# Patient Record
Sex: Male | Born: 1960 | Hispanic: Yes | Marital: Married | Smoking: Never smoker
Health system: Southern US, Community
[De-identification: ages and names within clinical notes are randomized; demographics above are authoritative.]

## PROBLEM LIST (undated history)

## (undated) DIAGNOSIS — I10 Essential (primary) hypertension: Secondary | ICD-10-CM

## (undated) HISTORY — PX: BACK SURGERY: SHX140

## (undated) HISTORY — PX: KNEE SURGERY: SHX244

## (undated) HISTORY — DX: Essential (primary) hypertension: I10

---

## 2008-08-12 DEATH — deceased

## 2017-05-30 ENCOUNTER — Other Ambulatory Visit (HOSPITAL_COMMUNITY): Payer: Self-pay | Admitting: Sports Medicine

## 2017-05-30 DIAGNOSIS — M25562 Pain in left knee: Secondary | ICD-10-CM

## 2017-06-02 ENCOUNTER — Ambulatory Visit (HOSPITAL_COMMUNITY)
Admission: RE | Admit: 2017-06-02 | Discharge: 2017-06-02 | Disposition: A | Discharge status: Home / Self Care | Payer: BLUE CROSS/BLUE SHIELD | Source: Ambulatory Visit | Attending: Sports Medicine | Admitting: Sports Medicine

## 2017-06-02 DIAGNOSIS — M25562 Pain in left knee: Secondary | ICD-10-CM | POA: Diagnosis present

## 2017-06-02 DIAGNOSIS — X58XXXA Exposure to other specified factors, initial encounter: Secondary | ICD-10-CM | POA: Insufficient documentation

## 2017-06-02 DIAGNOSIS — S83242A Other tear of medial meniscus, current injury, left knee, initial encounter: Secondary | ICD-10-CM | POA: Diagnosis not present

## 2017-06-02 DIAGNOSIS — M7122 Synovial cyst of popliteal space [Baker], left knee: Secondary | ICD-10-CM | POA: Diagnosis not present

## 2017-08-29 ENCOUNTER — Other Ambulatory Visit (HOSPITAL_COMMUNITY): Payer: Self-pay | Admitting: Orthopedic Surgery

## 2017-08-29 DIAGNOSIS — M7989 Other specified soft tissue disorders: Principal | ICD-10-CM

## 2017-08-29 DIAGNOSIS — M79605 Pain in left leg: Secondary | ICD-10-CM

## 2017-08-30 ENCOUNTER — Ambulatory Visit (HOSPITAL_COMMUNITY)
Admission: RE | Admit: 2017-08-30 | Discharge: 2017-08-30 | Disposition: A | Discharge status: Home / Self Care | Payer: BLUE CROSS/BLUE SHIELD | Source: Ambulatory Visit | Attending: Orthopedic Surgery | Admitting: Orthopedic Surgery

## 2017-08-30 DIAGNOSIS — M7989 Other specified soft tissue disorders: Secondary | ICD-10-CM | POA: Diagnosis present

## 2017-08-30 DIAGNOSIS — M79605 Pain in left leg: Secondary | ICD-10-CM | POA: Diagnosis present

## 2017-08-30 DIAGNOSIS — M7122 Synovial cyst of popliteal space [Baker], left knee: Secondary | ICD-10-CM | POA: Insufficient documentation

## 2018-11-15 IMAGING — US US EXTREM LOW VENOUS*L*
1 series · 13 of 24 positions shown · non-contrast
Comparison: None.

CLINICAL DATA: 56-year-old male with a history of left lower
extremity pain and swelling



[Series 1: us extrem low venous*left* · 0.08mm/px · 66 acquisitions, 13 frames shown]
[im 1/66]
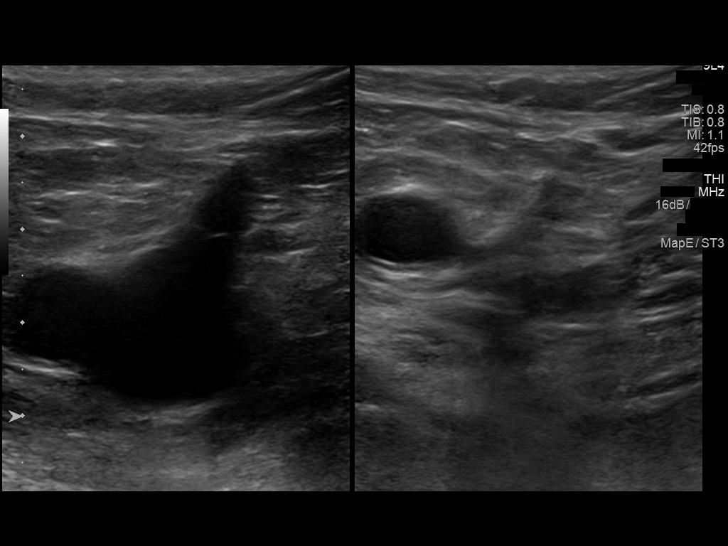
[im 6/66]
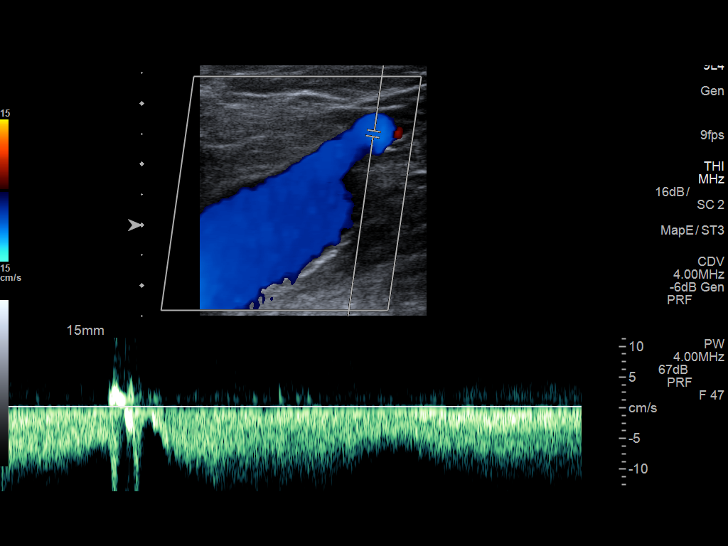
[im 12/66]
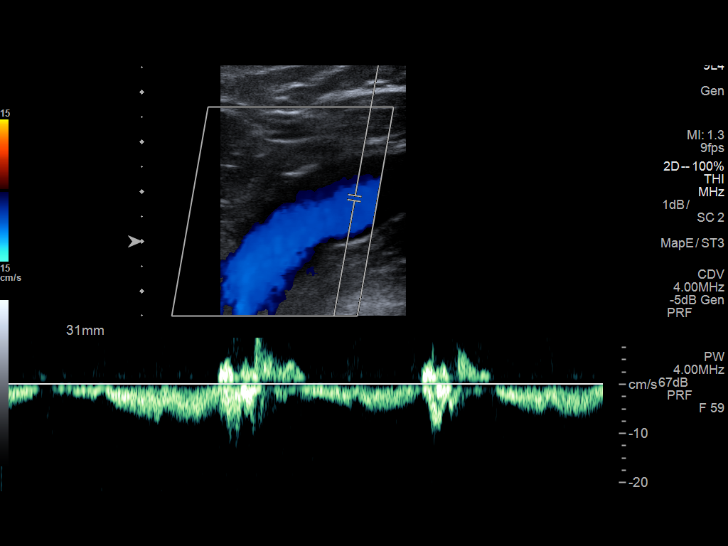
[im 17/66]
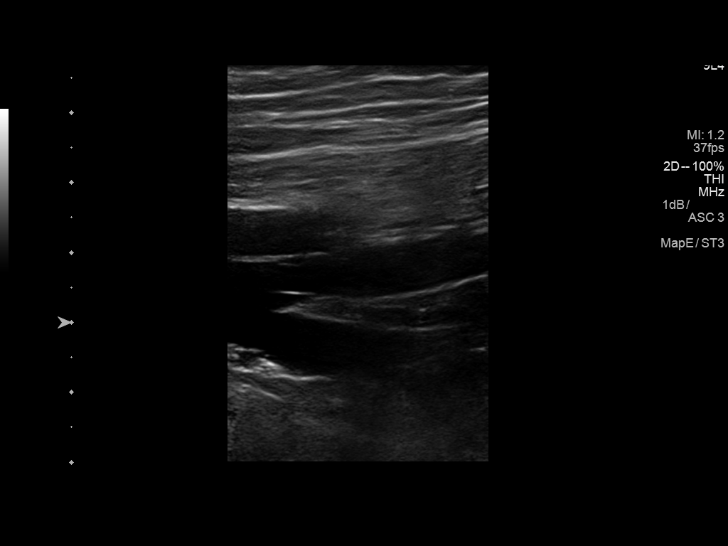
[im 23/66]
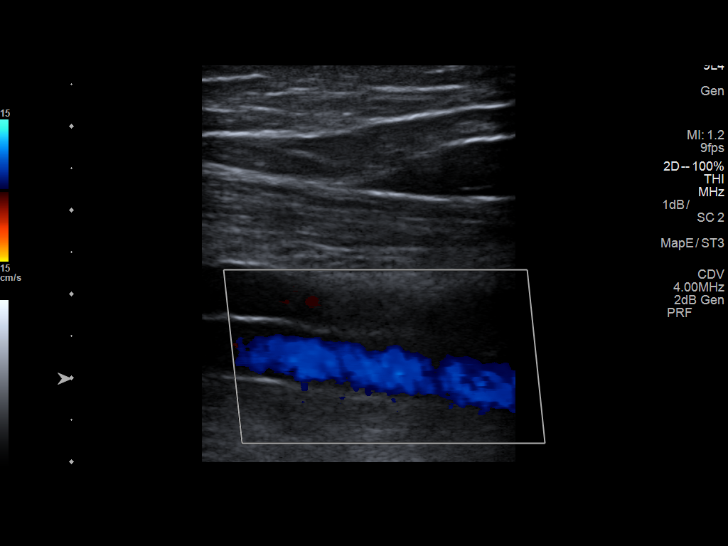
[im 29/66]
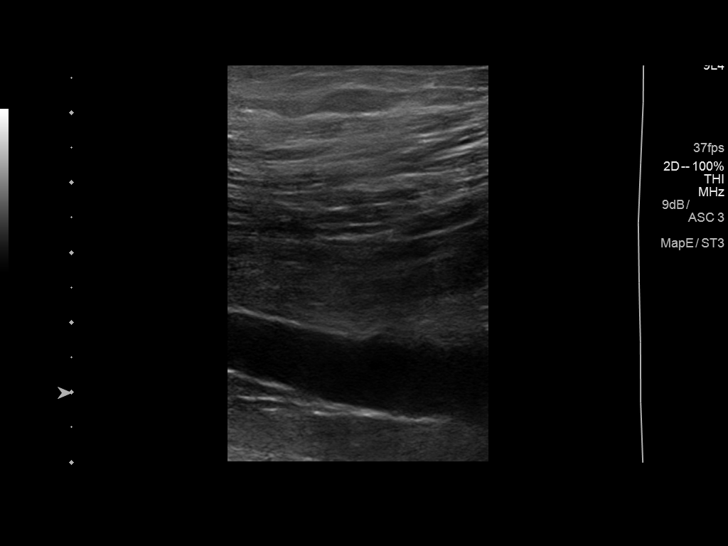
[im 37/66]
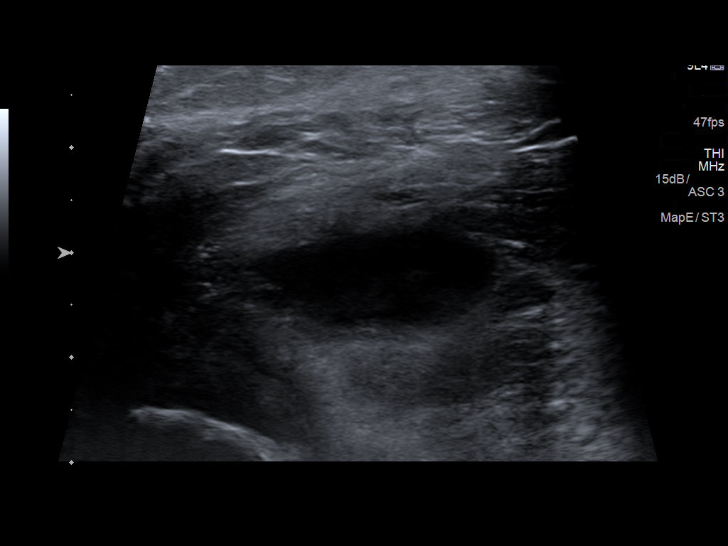
[im 37/66]
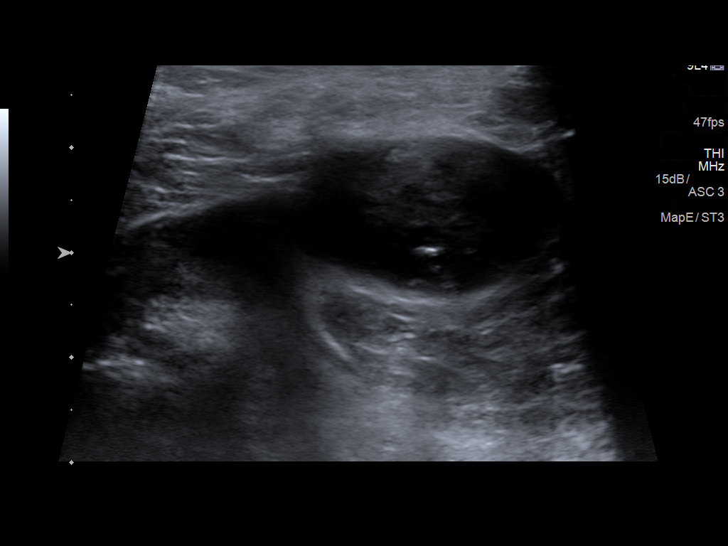
[im 43/66]
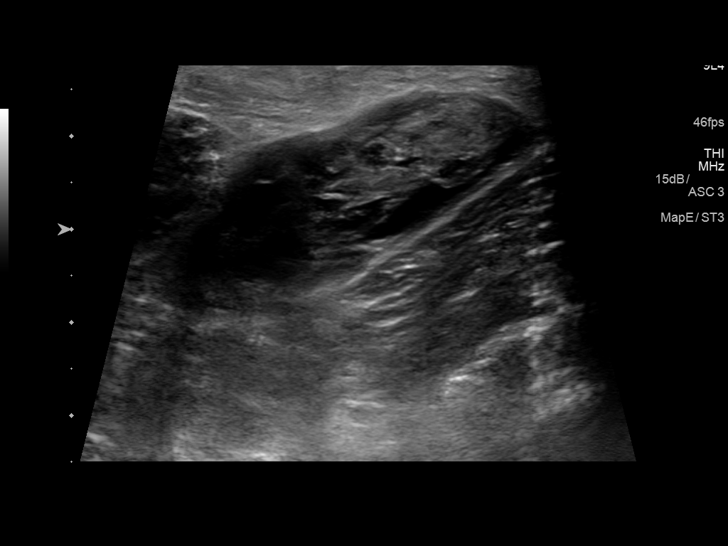
[im 49/66]
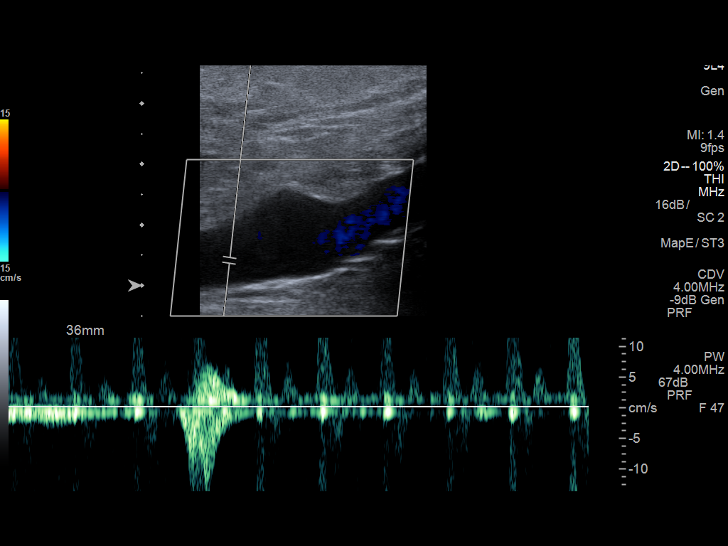
[im 54/66]
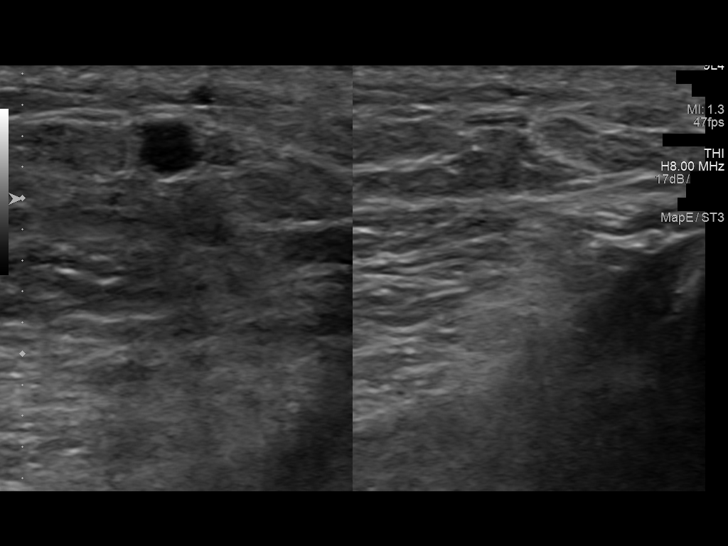
[im 60/66]
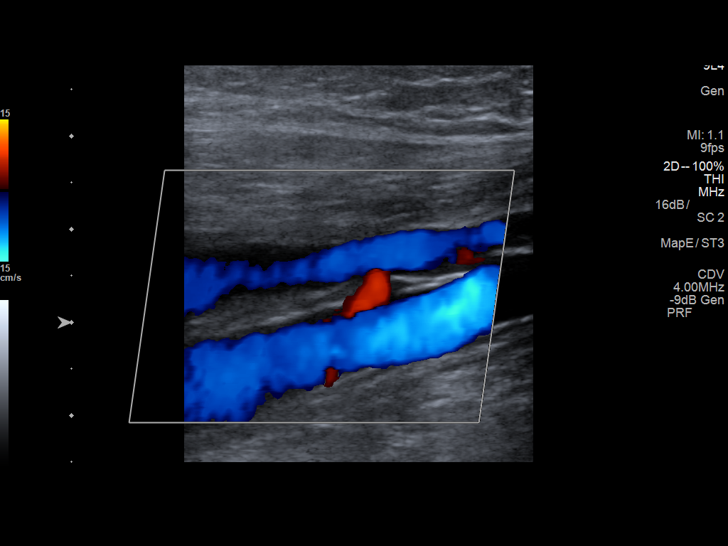
[im 66/66]
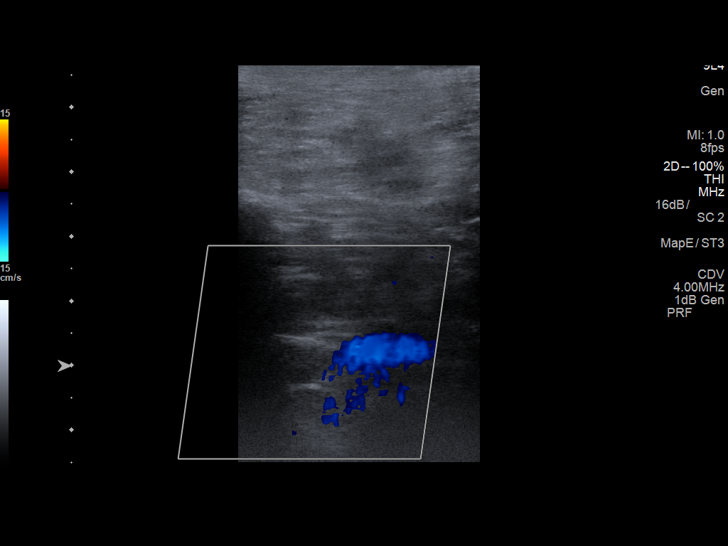

[13 of 24 positions shown; findings below may reference images not displayed]

FINDINGS: Contralateral Common Femoral Vein: Respiratory phasicity is normal
and symmetric with the symptomatic side. No evidence of thrombus.
Normal compressibility.

Common Femoral Vein: No evidence of thrombus. Normal
compressibility, respiratory phasicity and response to augmentation.

Saphenofemoral Junction: No evidence of thrombus. Normal
compressibility and flow on color Doppler imaging.

Profunda Femoral Vein: No evidence of thrombus. Normal
compressibility and flow on color Doppler imaging.

Femoral Vein: No evidence of thrombus. Normal compressibility,
respiratory phasicity and response to augmentation.

Popliteal Vein: No evidence of thrombus. Normal compressibility,
respiratory phasicity and response to augmentation.

Calf Veins: No evidence of thrombus. Normal compressibility and flow
on color Doppler imaging.

Superficial Great Saphenous Vein: No evidence of thrombus. Normal
compressibility and flow on color Doppler imaging.

Other Findings: Cystic structure in the popliteal region with
internal complexity measuring 5.0 cm x 4.7 cm x 1.7 cm
IMPRESSION: Sonographic survey of the left lower extremity negative for DVT.

Complex Baker's cyst in the left popliteal region

## 2019-03-19 ENCOUNTER — Ambulatory Visit (INDEPENDENT_AMBULATORY_CARE_PROVIDER_SITE_OTHER): Payer: 59 | Admitting: Family Medicine

## 2019-03-19 ENCOUNTER — Encounter (INDEPENDENT_AMBULATORY_CARE_PROVIDER_SITE_OTHER): Payer: Self-pay

## 2019-03-19 ENCOUNTER — Encounter (INDEPENDENT_AMBULATORY_CARE_PROVIDER_SITE_OTHER): Payer: Self-pay | Admitting: *Deleted

## 2019-03-19 ENCOUNTER — Other Ambulatory Visit: Payer: Self-pay

## 2019-03-19 ENCOUNTER — Encounter: Payer: Self-pay | Admitting: Family Medicine

## 2019-03-19 VITALS — BP 144/98 | HR 74 | Temp 98.6°F | Resp 15 | Ht 64.0 in | Wt 194.0 lb

## 2019-03-19 DIAGNOSIS — Z23 Encounter for immunization: Secondary | ICD-10-CM | POA: Diagnosis not present

## 2019-03-19 DIAGNOSIS — M62838 Other muscle spasm: Secondary | ICD-10-CM

## 2019-03-19 DIAGNOSIS — Z1159 Encounter for screening for other viral diseases: Secondary | ICD-10-CM | POA: Insufficient documentation

## 2019-03-19 DIAGNOSIS — I10 Essential (primary) hypertension: Secondary | ICD-10-CM

## 2019-03-19 DIAGNOSIS — E669 Obesity, unspecified: Secondary | ICD-10-CM

## 2019-03-19 DIAGNOSIS — Z125 Encounter for screening for malignant neoplasm of prostate: Secondary | ICD-10-CM

## 2019-03-19 DIAGNOSIS — Z1211 Encounter for screening for malignant neoplasm of colon: Secondary | ICD-10-CM | POA: Diagnosis not present

## 2019-03-19 HISTORY — DX: Encounter for screening for malignant neoplasm of prostate: Z12.5

## 2019-03-19 HISTORY — DX: Other muscle spasm: M62.838

## 2019-03-19 NOTE — Assessment & Plan Note (Signed)
US task force recommendation 

## 2019-03-19 NOTE — Assessment & Plan Note (Signed)
Rather acute within 24-hour onset of trapezius tightness muscle spasm pain.  Encouraged to stretch and use Tylenol and heating pad to help with discomfort.  As this all seems muscle related if not better within a week or 2 advised to call the office back.Patient acknowledged agreement and understanding of the plan.

## 2019-03-19 NOTE — Assessment & Plan Note (Signed)
Will be getting updated on labs and kidney function.  Prior to adjustment to medications. Strongly encouraged to follow DASH diet.  And to do 30 minutes of exercise on most days of the week.  Pending what kidney function is we will be adjusting medications as blood pressure was 140/94 on recheck.

## 2019-03-19 NOTE — Assessment & Plan Note (Signed)
Nicholas Howard is educated about the importance of exercise daily to help with weight management. A minumum of 30 minutes daily is recommended. Additionally, importance of healthy food choices  with portion control discussed.   Wt Readings from Last 3 Encounters:  03/19/19 194 lb 0.6 oz (88 kg)

## 2019-03-19 NOTE — Assessment & Plan Note (Signed)
Colonoscopy referral made today

## 2019-03-19 NOTE — Progress Notes (Signed)
Subjective:  Patient ID: Nicholas Howard, male    DOB: [birth year]  Age: 59 y.o. MRN: [id number]  CC:  Chief Complaint  Patient presents with  . New Patient (Initial Visit)    establish care      HPI  HPI Mr Nicholas Howard is a 59 year old male patient who presents today to establish care. Daughter is here to help provide translation. Previously seen at the health department but now has insurance.  Reports that he takes only one medication amlodipine benazepril combo pill.  Reports he has been taking this for 2 to 3 years.  Only history that he is aware of his hypertension.  Denies having any chest pain, leg swelling, headaches, dizziness, palpitations in the chest.  Any other signs of symptoms at this time of uncontrolled blood pressure.  Blood pressure is a little bit elevated today in the office at 144/98.  Does not currently check blood pressure at home though daughter reports that he has a cuff.    Would like to get a referral for colonoscopy.  In addition to having updated blood work.  Only concern today outside of getting blood pressure better control.  Is he has some upper back pain hurts predominantly with twisting and movement.  Denies having any injury or trauma.  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  Denies having any trouble with breathing causing difficulty or pain.  Denies have any change in range of motion.  Has not tried anything over-the-counter for this at this time.  Pain is a 4 5 out of 10 with movement.  Lives with wife and youngest daughter.  1 daughter is here to help with translation services.  Predominantly does understand English though.  Does not eat the best diet but avoids pork.  Has not had any reduction in salt.  Is not currently physically active outside of job.  But does enjoy going on walks when the weather is nice.  Has 2 dogs in the home.  Today patient denies signs and symptoms of COVID 19  infection including fever, chills, cough, shortness of breath, and headache. Past Medical, Surgical, Social History, Allergies, and Medications have been Reviewed.   Past Medical History:  Diagnosis Date  . Hypertension     Current Meds  Medication Sig  . amLODipine-benazepril (LOTREL) 10-20 MG capsule Take 1 capsule by mouth daily.    ROS:  Review of Systems  Constitutional: Negative.   HENT: Negative.   Eyes: Negative.   Respiratory: Negative.   Cardiovascular: Negative.   Gastrointestinal: Negative.   Genitourinary: Negative.   Musculoskeletal: Negative.   Skin: Negative.   Neurological: Negative.   Endo/Heme/Allergies: Negative.   Psychiatric/Behavioral: Negative.   All other systems reviewed and are negative.    Objective:   Today's Vitals: BP (!) 144/98   Pulse 74   Temp 98.6 F (37 C) (Oral)   Resp 15   Ht 5\' 4"  (1.626 m)   Wt 194 lb 0.6 oz (88 kg)   SpO2 98%   BMI 33.31 kg/m  Vitals with BMI 03/19/2019  Height 5\' 4"   Weight 194 lbs 1 oz  BMI [id number]  Systolic 123456  Diastolic 98  Pulse 74     Physical Exam Vitals and nursing note reviewed.  Constitutional:      Appearance: Normal appearance. He is well-developed and well-groomed. He is obese.  HENT:     Head: Normocephalic and atraumatic.  Right Ear: External ear normal.     Left Ear: External ear normal.     Nose: Nose normal.     Mouth/Throat:     Mouth: Mucous membranes are moist.     Pharynx: Oropharynx is clear.  Eyes:     General:        Right eye: No discharge.        Left eye: No discharge.     Conjunctiva/sclera: Conjunctivae normal.  Cardiovascular:     Rate and Rhythm: Normal rate and regular rhythm.     Pulses: Normal pulses.     Heart sounds: Normal heart sounds.  Pulmonary:     Effort: Pulmonary effort is normal.     Breath sounds: Normal breath sounds.  Musculoskeletal:        General: Normal range of motion.     Cervical back: Normal range of motion and neck supple.       Thoracic back: Spasms and tenderness present.     Comments: Tightness noted over the upper traps extending down to base of scapula. ROM intact   Skin:    General: Skin is warm.  Neurological:     General: No focal deficit present.     Mental Status: He is alert and oriented to person, place, and time.  Psychiatric:        Attention and Perception: Attention normal.        Mood and Affect: Mood normal.        Speech: Speech normal.        Behavior: Behavior normal. Behavior is cooperative.        Thought Content: Thought content normal.        Cognition and Memory: Cognition normal.        Judgment: Judgment normal.     Assessment   1. Essential hypertension   2. Obesity (BMI 30.0-34.9)   3. Encounter for hepatitis C screening test for low risk patient   4. Encounter for screening for malignant neoplasm of colon   5. Encounter for screening for malignant neoplasm of prostate   6. Trapezius muscle spasm     Tests ordered Orders Placed This Encounter  Procedures  . CBC  . COMPLETE METABOLIC PANEL WITH GFR  . Hemoglobin A1c  . Lipid panel  . HEP C AB W/REFL  . PSA  . Ambulatory referral to Gastroenterology   Plan: Please see assessment and plan per problem list above.   No orders of the defined types were placed in this encounter.   Patient to follow-up in 1 month blood pressure reading.  Perlie Mayo, NP

## 2019-03-19 NOTE — Assessment & Plan Note (Signed)
PSA level to be checked.

## 2019-03-19 NOTE — Patient Instructions (Addendum)
I appreciate the opportunity to provide you with care for your health and wellness. It was so nice to meet you both today.  Today we discussed: establish care  Follow up: 1 month for BP readings   Labs today  Will call once they are back and adjust medication based of them  GOALS: Work on increasing exercise, walking 30-60 minutes 4-5 days a week.  AVOID SALT  Increase water intake and veggies  Please continue to practice social distancing to keep you, your family, and our community safe.  If you must go out, please wear a mask and practice good handwashing.  It was a pleasure to see you and I look forward to continuing to work together on your health and well-being. Please do not hesitate to call the office if you need care or have questions about your care.  Have a wonderful day and week. With Gratitude, Cherly Beach, DNP, AGNP-BC

## 2019-03-20 LAB — PSA: PSA: 0.9 ng/mL (ref <=4.0)

## 2019-03-20 LAB — COMPLETE METABOLIC PANEL WITH GFR
AG Ratio: 1.6 (calc) (ref 1.0–2.5)
ALT: 19 U/L (ref 9–46)
AST: 16 U/L (ref 10–35)
Albumin: 4.6 g/dL (ref 3.6–5.1)
Alkaline phosphatase (APISO): 39 U/L (ref 35–144)
BUN/Creatinine Ratio: 22 (calc) (ref 6–22)
BUN: 14 mg/dL (ref 7–25)
CO2: 24 mmol/L (ref 20–32)
Calcium: 9.5 mg/dL (ref 8.6–10.3)
Chloride: 107 mmol/L (ref 98–110)
Creat: 0.64 mg/dL — ABNORMAL LOW (ref 0.70–1.33)
GFR, Est African American: 125 mL/min/{1.73_m2} (ref >=60)
GFR, Est Non African American: 108 mL/min/{1.73_m2} (ref >=60)
Globulin: 2.8 g/dL (calc) (ref 1.9–3.7)
Glucose, Bld: 88 mg/dL (ref 65–99)
Potassium: 4.2 mmol/L (ref 3.5–5.3)
Sodium: 140 mmol/L (ref 135–146)
Total Bilirubin: 1.2 mg/dL (ref 0.2–1.2)
Total Protein: 7.4 g/dL (ref 6.1–8.1)

## 2019-03-20 LAB — HEMOGLOBIN A1C
Hgb A1c MFr Bld: 5.3 % of total Hgb (ref <5.7)
Mean Plasma Glucose: 105 (calc)
eAG (mmol/L): 5.8 (calc)

## 2019-03-20 LAB — LIPID PANEL
Cholesterol: 181 mg/dL (ref <200)
HDL: 49 mg/dL (ref >=40)
LDL Cholesterol (Calc): 109 mg/dL (calc) — ABNORMAL HIGH
Non-HDL Cholesterol (Calc): 132 mg/dL (calc) — ABNORMAL HIGH (ref <130)
Total CHOL/HDL Ratio: 3.7 (calc) (ref <5.0)
Triglycerides: 121 mg/dL (ref <150)

## 2019-03-20 LAB — CBC
HCT: 46.9 % (ref 38.5–50.0)
Hemoglobin: 16.1 g/dL (ref 13.2–17.1)
MCH: 30.4 pg (ref 27.0–33.0)
MCHC: 34.3 g/dL (ref 32.0–36.0)
MCV: 88.5 fL (ref 80.0–100.0)
MPV: 12.7 fL — ABNORMAL HIGH (ref 7.5–12.5)
Platelets: 187 10*3/uL (ref 140–400)
RBC: 5.3 10*6/uL (ref 4.20–5.80)
RDW: 12.7 % (ref 11.0–15.0)
WBC: 5.8 10*3/uL (ref 3.8–10.8)

## 2019-03-20 LAB — REFLEX TIQ

## 2019-03-20 LAB — HEP C AB W/REFL
HEPATITIS C ANTIBODY REFILL$(REFL): NONREACTIVE
SIGNAL TO CUT-OFF: 0.01 (ref <1.00)

## 2019-03-21 ENCOUNTER — Other Ambulatory Visit: Payer: Self-pay | Admitting: Family Medicine

## 2019-03-21 DIAGNOSIS — I10 Essential (primary) hypertension: Secondary | ICD-10-CM

## 2019-03-21 MED ORDER — AMLODIPINE BESY-BENAZEPRIL HCL 10-40 MG PO CAPS: 1.0000 | ORAL_CAPSULE | Freq: Every day | ORAL | 3 refills | Status: DC

## 2019-04-19 ENCOUNTER — Ambulatory Visit (INDEPENDENT_AMBULATORY_CARE_PROVIDER_SITE_OTHER): Payer: 59 | Admitting: Family Medicine

## 2019-04-19 ENCOUNTER — Other Ambulatory Visit: Payer: Self-pay

## 2019-04-19 ENCOUNTER — Encounter: Payer: Self-pay | Admitting: Family Medicine

## 2019-04-19 VITALS — BP 126/82 | HR 65 | Temp 97.7°F | Resp 15 | Ht 64.0 in | Wt 193.1 lb

## 2019-04-19 DIAGNOSIS — E669 Obesity, unspecified: Secondary | ICD-10-CM | POA: Diagnosis not present

## 2019-04-19 DIAGNOSIS — E785 Hyperlipidemia, unspecified: Secondary | ICD-10-CM | POA: Insufficient documentation

## 2019-04-19 DIAGNOSIS — E7841 Elevated Lipoprotein(a): Secondary | ICD-10-CM

## 2019-04-19 DIAGNOSIS — I10 Essential (primary) hypertension: Secondary | ICD-10-CM | POA: Diagnosis not present

## 2019-04-19 DIAGNOSIS — E66811 Obesity, class 1: Secondary | ICD-10-CM

## 2019-04-19 NOTE — Assessment & Plan Note (Addendum)
Obesity is linked to hypertension and elevated lipoprotein.   educated about the importance of exercise daily to help with weight management. A minumum of 30 minutes daily is recommended. Additionally, importance of healthy food choices  with portion control discussed.  Wt Readings from Last 3 Encounters:  04/19/19 193 lb 1.9 oz (87.6 kg)  03/19/19 194 lb 0.6 oz (88 kg)

## 2019-04-19 NOTE — Assessment & Plan Note (Signed)
Nicholas Howard is encouraged to maintain a well balanced diet that is low in salt. Controlled, continue current medication regimen.  No refills needed  Additionally, he is also reminded that exercise is beneficial for heart health and control of  Blood pressure. 30-60 minutes daily is recommended-walking was suggested. Daughter reports that they got an air [name] and has been using it.  Will provide Spanish version of both DASH diet and cholesterol content for food.

## 2019-04-19 NOTE — Progress Notes (Signed)
Subjective:  Patient ID: Nicholas Howard, male    DOB: 1960-05-01  Age: 59 y.o. MRN: [id number]  CC:  Chief Complaint  Patient presents with  . Hypertension    follow up bp evaluation      HPI  HPI  Mr Nicholas Howard is a 59 year old male patient who presents today to follow-up with hypertension.  Daughter is here to help with translation/interpretation.  He was on amlodipine benazepril combo pill when I first met him back in January.  Blood pressure was elevated that day in the office 144/98.  He denied having any signs or awareness of hypertension.  And was not currently checking his blood pressure at home even though he had a cuff.  Labs at that visit were really good.  No need to adjust medications.  Blood pressure today in the office 126/82.  Reports that he is trying to eat better as well to help with his cholesterol.  Daughter reports that they got an air [place].  And they would like a list of foods if possible that he could have safely with cholesterol diet.  Is trying to follow a DASH diet as well.  Is not currently doing much exercise.  Has no other concerns or questions today in the office.  Today patient denies signs and symptoms of COVID 19 infection including fever, chills, cough, shortness of breath, and headache. Past Medical, Surgical, Social History, Allergies, and Medications have been Reviewed.   Past Medical History:  Diagnosis Date  . Encounter for screening for malignant neoplasm of prostate 03/19/2019  . Hypertension   . Trapezius muscle spasm 03/19/2019    Current Meds  Medication Sig  . amLODipine-benazepril (LOTREL) 10-40 MG capsule Take 1 capsule by mouth daily.    ROS:  Review of Systems  Constitutional: Negative.   HENT: Negative.   Eyes: Negative.   Respiratory: Negative.   Cardiovascular: Negative.   Gastrointestinal: Negative.   Genitourinary: Negative.   Musculoskeletal: Negative.   Skin: Negative.   Neurological: Negative.    Endo/Heme/Allergies: Negative.   Psychiatric/Behavioral: Negative.   All other systems reviewed and are negative.    Objective:   Today's Vitals: BP 126/82   Pulse 65   Temp 97.7 F (36.5 C) (Temporal)   Resp 15   Ht '5\' 4"'  (1.626 m)   Wt 193 lb 1.9 oz (87.6 kg)   SpO2 98%   BMI 33.15 kg/m  Vitals with BMI 04/19/2019 03/19/2019  Height '5\' 4"'  '5\' 4"'   Weight 193 lbs 2 oz 194 lbs 1 oz  BMI 78.29 56.21  Systolic 308 657  Diastolic 82 98  Pulse 65 74     Physical Exam Vitals and nursing note reviewed.  Constitutional:      Appearance: Normal appearance. He is well-developed and well-groomed. He is obese.  HENT:     Head: Normocephalic and atraumatic.     Right Ear: External ear normal.     Left Ear: External ear normal.     Nose: Nose normal.     Mouth/Throat:     Mouth: Mucous membranes are moist.     Pharynx: Oropharynx is clear.  Eyes:     General:        Right eye: No discharge.        Left eye: No discharge.     Conjunctiva/sclera: Conjunctivae normal.  Cardiovascular:     Rate and Rhythm: Normal rate and regular rhythm.     Pulses: Normal pulses.  Heart sounds: Normal heart sounds.  Pulmonary:     Effort: Pulmonary effort is normal.     Breath sounds: Normal breath sounds.  Musculoskeletal:        General: Normal range of motion.     Cervical back: Normal range of motion and neck supple.  Skin:    General: Skin is warm.  Neurological:     General: No focal deficit present.     Mental Status: He is alert and oriented to person, place, and time.  Psychiatric:        Attention and Perception: Attention normal.        Mood and Affect: Mood normal.        Speech: Speech normal.        Behavior: Behavior normal. Behavior is cooperative.        Thought Content: Thought content normal.        Cognition and Memory: Cognition normal.        Judgment: Judgment normal.      Assessment   1. Essential hypertension   2. Obesity (BMI 30.0-34.9)   3. Elevated  lipoprotein(a)     Tests ordered No orders of the defined types were placed in this encounter.    Plan: Please see assessment and plan per problem list above.   No orders of the defined types were placed in this encounter.   Patient to follow-up in 08/16/2019 annual   Perlie Mayo, NP

## 2019-04-19 NOTE — Patient Instructions (Addendum)
Happy New Year! May you have a year filled with hope, love, happiness and laughter.  I appreciate the opportunity to provide you with care for your health and wellness. Today we discussed:   Follow up: 4-5 month annual   No labs or referrals today  Attached in a list of cholesterol content in foods.  Please stick to 200 mg or lower daily   Please continue to practice social distancing to keep you, your family, and our community safe.  If you must go out, please wear a mask and practice good handwashing.  It was a pleasure to see you and I look forward to continuing to work together on your health and well-being. Please do not hesitate to call the office if you need care or have questions about your care.  Have a wonderful day and week. With Gratitude, Cherly Beach, DNP, AGNP-BC   Contenido de colesterol en los alimentos Cholesterol Content in Foods El colesterol es una sustancia cerosa, parecida a la grasa, que contribuye a transportar la grasa en la [place]. El cuerpo Occupational hygienist en pequeas cantidades, pero el exceso de colesterol puede causar dao en las arterias y Film/video editor. La mayora de las personas debera consumir menos de 200 miligramos (mg) de Special educational needs teacher. Alimentos con colesterol  El colesterol se encuentra en los alimentos de origen animal, como la carne, los pescados y mariscos y los productos lcteos. En general, los productos lcteos descremados y las carnes magras tienen menos colesterol que los productos lcteos enteros y las carnes grasas. A continuacin, se enumeran los miligramos de colesterol por porcin (mg por porcin) de los alimentos comunes que contienen colesterol. Carne y otras protenas  Huevo: un huevo entero grande tiene 186mg .  Pata de ternera: 4 onzas (115g) tienen 141mg .  Carne picada magra de pavo (93% magra): 4 onzas (115g) tienen 118mg .  Lomo de cordero desgrasado: 4 onzas (115g) tienen 106mg .  Carne picada magra de  res (90% magra): 4 onzas (115g) tienen 100mg .  Bethena Midget: 3,5 onzas (100g) tienen 90mg .  Chuletas de cerdo: 4 onzas (115g) tienen 86mg .  Salmn enlatado: 3,5 onzas (100g) tienen 83mg .  Lomo de res desgrasado: 4 onzas (115g) tienen 78mg .  Salchicha: 1 salchicha (3,5 onzas o 100g) tiene 77mg .  Cangrejo: 3,5 onzas (100g) tienen 71mg .  Pollo asado sin piel, carne blanca: 4 onzas (115g) tienen 66mg .  Salchichn light: 2 onzas (57g) tienen 45mg .  Fiambre de pavita: 2 onzas (57g) tienen 31mg .  Atn enlatado: 3,5 onzas (100g) tienen 31mg .  Tocino: 1 onza (28g) tiene 29mg .  Ostras y mejillones (crudos): 3,5 onzas (100g) tienen 25mg .  Caballa: 1 onza (28g) tiene 22mg .  Trucha: 1 onza (28g) tiene 20mg .  Salchicha de cerdo: 1 salchicha (1 onza o 28g) tiene 17mg .  Salmn: 1 onza (28g) tiene 16mg .  Tilapia: 1 onza (28g) tiene 14 mg. Lcteos  Helado cremoso:  taza (4 onzas o 115g) tiene 103mg .  Yogur entero: 1 taza (8 onzas o 227g) tiene 29mg .  Queso cheddar: 1onza (28g) tiene 28mg .  Queso americano: 1 onza (28g) tiene 28mg .  Leche entera: 1 taza (8 onzas o 227g) tiene 23mg .  Leche con contenido reducido de grasa (2%): 1 taza (8 onzas o 227g) tiene 18mg .  Queso crema: 1 cucharada tiene 15mg .  Requesn:  taza (4 onzas o 115g) tiene 14mg .  Leche con bajo contenido de grasa (1%): 1 taza (8 onzas o 227g) tiene 10mg .  Crema cida: 1 cucharada tiene 8,5mg .  Yogur con bajo contenido de  grasa: 1 taza (8 onzas o 227g) tiene 8mg .  Yogur griego sin contenido de grasa: 1 taza (8 onzas o 227g) tiene 7mg .  Crema de [place]: 1 cucharada tiene 5mg . Grasas y aceites  Aceite de hgado de bacalao: 1 cucharada tiene 82mg .  Manteca: 1 cucharada tiene 15mg .  Grasa de cerdo: 1 cucharada tiene 14mg .  Grasa de tocino: 1 cucharada tiene 14mg .  Mayonesa: 1 cucharada tiene de 5 a 10mg .  Margarina: 1 cucharada tiene de 3  a 10mg . Las cantidades exactas de colesterol que contienen estos alimentos pueden variar dependiendo de los ingredientes especficos y las [place]. Alimentos sin colesterol La mayora de los alimentos a base de plantas no tiene colesterol, a menos que se combinen con un alimento que s tiene Research officer, trade union. Algunos alimentos sin colesterol son los siguientes:  Granos y Actor.  Verduras.  Lambert Mody.  Aceites vegetales, como el de [place], de canola y de [place].  Legumbres, [employer], los frijoles y las lentejas.  Frutos secos y semillas.  Claras de huevo. Resumen  El cuerpo necesita colesterol en pequeas cantidades, pero el exceso de colesterol puede causar dao en las arterias y Film/video editor.  La mayora de las personas debera consumir menos de 200 miligramos (mg) de Special educational needs teacher. Esta informacin no tiene Marine scientist el consejo del mdico. Asegrese de hacerle al mdico cualquier pregunta que tenga. Document Revised: 01/27/2017 Document Reviewed: 01/27/2017 Elsevier Patient Education  [place] de alimentacin DASH DASH Eating Plan DASH es la sigla en ingls de "Enfoques Alimentarios para Detener la Hipertensin" (Dietary Approaches to Stop Hypertension). El plan de alimentacin DASH ha demostrado bajar la presin arterial elevada (hipertensin). Tambin puede reducir [employer] de diabetes tipo 2, enfermedad cardaca y accidente cerebrovascular. Este plan tambin puede ayudar a Horticulturist, commercial. Consejos para seguir este plan  Pautas generales  Evite ingerir ms de 2,300 mg (miligramos) de sal (sodio) por da. Si tiene hipertensin, es posible que necesite reducir la ingesta de sodio a 1,500 mg por da.  Limite el consumo de alcohol a no ms de 25medida por da si es mujer y no est [place], y 71medidas por da si es hombre. Una medida equivale a 12oz (374ml) de cerveza, 5oz (162ml) de vino o 1oz (78ml) de bebidas alcohlicas de alta  graduacin.  Trabaje con su mdico para mantener un peso saludable o perder [employer]. Pregntele cul es el peso recomendado para usted.  Realice al menos 30 minutos de ejercicio que haga que se acelere su corazn (ejercicio Arboriculturist) la [employer] de la [place]. Estas actividades pueden incluir caminar, nadar o andar en bicicleta.  Trabaje con su mdico o especialista en alimentacin y nutricin (nutricionista) para ajustar su plan alimentario a sus necesidades calricas personales. Lectura de las etiquetas de los alimentos   Verifique en las etiquetas de los alimentos, la cantidad de sodio por porcin. Elija alimentos con menos del 5 por ciento del valor diario de sodio. Generalmente, los alimentos con menos de 300 mg de sodio por porcin se encuadran dentro de este plan alimentario.  Para encontrar cereales integrales, busque la palabra "integral" como primera palabra en la lista de ingredientes. De compras  Compre productos en los que en su etiqueta diga: "bajo contenido de sodio" o "sin agregado de sal".  Compre alimentos frescos. Evite los alimentos enlatados y comidas precocidas o congeladas. Coccin  Evite agregar sal cuando cocine. Use hierbas o aderezos sin sal, en lugar de sal de  mesa o sal marina. Consulte al mdico o farmacutico antes de usar sustitutos de la sal.  No fra los alimentos. A la hora de cocinar los alimentos opte por hornearlos, hervirlos, grillarlos y asarlos a Administrator, arts.  Cocine con aceites cardiosaludables, como oliva, canola, soja o girasol. Planificacin de las comidas  Consuma una dieta equilibrada, que incluya lo siguiente: ? 5o ms porciones de frutas y Set designer. Trate de que la mitad del plato de cada comida sean frutas y verduras. ? Hasta 6 u 8 porciones de cereales integrales por da. ? Menos de 6 onzas de carne, aves o pescado Games developer. Una porcin de 3 onzas de carne tiene casi el mismo tamao que un mazo de cartas. Un huevo  equivale a 1 onza. ? Dos porciones de productos lcteos descremados por Training and development officer. ? Una porcin de frutos secos, semillas o frijoles 5 veces por semana. ? Grasas cardiosaludables. Las grasas saludables llamadas cidos grasos omega-3 se encuentran en alimentos como semillas de lino y pescados de agua fra, como por ejemplo, sardinas, salmn y caballa.  Limite la cantidad que ingiere de los siguientes alimentos: ? Alimentos enlatados o envasados. ? Alimentos con alto contenido de grasa trans, como alimentos fritos. ? Alimentos con alto contenido de grasa saturada, como carne con grasa. ? Dulces, postres, bebidas azucaradas y otros alimentos con azcar agregada. ? Productos lcteos enteros.  No le agregue sal a los alimentos antes de probarlos.  Trate de comer al menos 2 comidas vegetarianas por semana.  Consuma ms comida casera y menos de restaurante, de bufs y comida rpida.  Cuando coma en un restaurante, pida que preparen su comida con menos sal o, en lo posible, sin nada de sal. Qu alimentos se recomiendan? Los alimentos enumerados a continuacin no constituyen Furniture conservator/restorer. Hable con el nutricionista sobre las mejores opciones alimenticias para usted. Cereales Pan de salvado o integral. Pasta de salvado o integral. Arroz integral. Avena. Quinua. Trigo burgol. Cereales integrales y con bajo contenido de sodio. Pan pita. Galletitas de [place] con bajo contenido de [place] y [place]. Tortillas de [place] integral. Verduras Verduras frescas o congeladas (crudas, al vapor, asadas o grilladas). Jugos de tomate y verduras con bajo contenido de sodio o reducidos en sodio. Salsa y pasta de tomate con bajo contenido de sodio o reducidas en sodio. Verduras enlatadas con bajo contenido de sodio o reducidas en sodio. Frutas Todas las frutas frescas, congeladas o disecadas. Frutas enlatadas en jugo natural (sin agregado de azcar). Carne y otros alimentos proteicos Pollo o pavo sin piel. Carne de pollo  o de [place]. Cerdo desgrasado. Pescado y [employer]. Claras de huevo. Porotos, guisantes o lentejas secos. Frutos secos, mantequilla de frutos secos y semillas sin sal. Frijoles enlatados sin sal. Cortes de carne vacuna magra, desgrasada. Embutidos magros, con bajo contenido de [place]. Lcteos Leche descremada (1%) o descremada. Quesos sin grasa, con bajo contenido de grasa o descremados. Queso blanco o ricota sin grasa, con bajo contenido de [place]. Yogur semidescremado o descremado. Queso con bajo contenido de [place] y [place]. Grasas y [employer] untables que no contengan grasas trans. Aceite vegetal. Lubertha Basque y aderezos para ensaladas livianos o con bajo contenido de grasas (reducidos en sodio). Aceite de canola, crtamo, oliva, soja y [place]. [place]. Condimentos y otros alimentos Hierbas. Especias. Mezclas de condimentos sin sal. Palomitas de maz y pretzels sin sal. Dulces con bajo contenido de grasas. Qu alimentos no se recomiendan? Los alimentos enumerados a continuacin no constituyen Furniture conservator/restorer  lista completa. Hable con el nutricionista sobre las mejores opciones alimenticias para usted. Cereales Productos de panificacin hechos con grasa, como medialunas, magdalenas y algunos panes. Comidas con arroz o pasta seca listas para usar. Verduras Verduras con crema o fritas. Verduras en [place]. Verduras enlatadas regulares (que no sean con bajo contenido de sodio o reducidas en sodio). Pasta y salsa de tomates enlatadas regulares (que no sean con bajo contenido de sodio o reducidas en sodio). Jugos de tomate y verduras regulares (que no sean con bajo contenido de sodio o reducidos en sodio). Pepinillos. Aceitunas. Lambert Mody Fruta enlatada en almbar liviano o espeso. Frutas cocidas en aceite. Frutas con salsa de crema o [place]. Carne y otros alimentos proteicos Cortes de carne con grasa. Costillas. Carne frita. [place]ocino. Salchichas. Mortad[place] y otras carnes procesadas. Salame. Panceta.  Perros calientes (hotdogs). [place]. Frutos secos y semillas con sal. Frijoles enlatados con agregado de sal. Pescado enlatado o ahumado. Huevos enteros o yemas. Pollo o pavo con piel. Lcteos Leche entera o al 2%, crema y mitad leche y mitad crema. Queso crema entero o con toda su grasa. Yogur entero o endulzado. Quesos con toda su grasa. Sustitutos de cremas no lcteas. Coberturas batidas. Quesos para untar y quesos procesados. Grasas y [employer]. Margarina en barra. [place]. Materia grasa. Mantequilla clarificada. Grasa de panceta. Aceites tropicales como aceite de coco, palmiste o palma. Condimentos y otros alimentos Palomitas de maz y pretzels con sal. Sal de cebolla, sal de ajo, sal condimentada, sal de mesa y sal marina. Salsa [place]. Salsa trtara. Salsa barbacoa. Salsa teriyaki. Salsa de soja, incluso la que tiene contenido reducido de [place]. Salsa de carne. Salsas en lata y envasadas. Salsa de pescado. Salsa de [place]. Salsa rosada. Rbano picante envasado. [place]. Mostaza. Saborizantes y tiernizantes para carne. Caldo en cubitos. Salsa picante y salsa tabasco. Escabeches envasados o ya preparados. Aderezos para tacos prefabricados o envasados. Salsas. Aderezos comunes para ensalada. Dnde encontrar ms informacin:  [place], los Pulmones y Herbalist (National Heart, Lung, and [place]): https://wilson-eaton.com/  Asociacin Estadounidense del Corazn (American Heart Association): www.heart.org Resumen  El plan de alimentacin DASH ha demostrado bajar la presin arterial elevada (hipertensin). Tambin puede reducir UnitedHealth de diabetes tipo 2, enfermedad cardaca y accidente cerebrovascular.  Con el plan de alimentacin DASH, deber limitar el consumo de sal (sodio) a 2,300 mg por da. Si tiene hipertensin, es posible que necesite reducir la ingesta de sodio a 1,500 mg por da.  Cuando siga el plan de alimentacin DASH,  trate de comer ms frutas frescas y verduras, cereales integrales, carnes magras, lcteos descremados y grasas cardiosaludables.  Trabaje con su mdico o especialista en alimentacin y nutricin (nutricionista) para ajustar su plan alimentario a sus necesidades calricas personales. Esta informacin no tiene Marine scientist el consejo del mdico. Asegrese de hacerle al mdico cualquier pregunta que tenga. Document Revised: 06/20/2016 Document Reviewed: 06/20/2016 Elsevier Patient Education  [place].

## 2019-04-19 NOTE — Assessment & Plan Note (Signed)
Hyperlipidemia is linked to obesity.  Encouraged to eat a low-fat diet.  Provided with Spanish version of cholesterol content food.

## 2019-06-08 ENCOUNTER — Ambulatory Visit: Payer: 59 | Attending: Internal Medicine

## 2019-06-08 DIAGNOSIS — Z23 Encounter for immunization: Secondary | ICD-10-CM

## 2019-06-08 NOTE — Progress Notes (Signed)
   Covid-19 Vaccination Clinic  Name:  Nicholas Howard    MRN: [id number] DOB: [birth year]  06/08/2019  Mr. Nicholas Howard was observed post Covid-19 immunization for 15 minutes without incident. He was provided with Vaccine Information Sheet and instruction to access the V-Safe system.   Mr. Nicholas Howard was instructed to call 911 with any severe reactions post vaccine: Marland Kitchen Difficulty breathing  . Swelling of face and throat  . A fast heartbeat  . A bad rash all over body  . Dizziness and weakness   Immunizations Administered    Name Date Dose VIS Date Route   Pfizer COVID-19 Vaccine 06/08/2019  4:18 PM 0.3 mL 02/22/2019 Intramuscular   Manufacturer: [employer], [employer]   Lot: [id number]   [place]: [id number]

## 2019-06-21 ENCOUNTER — Other Ambulatory Visit: Payer: Self-pay

## 2019-06-21 ENCOUNTER — Telehealth: Payer: Self-pay

## 2019-06-21 DIAGNOSIS — I10 Essential (primary) hypertension: Secondary | ICD-10-CM

## 2019-06-21 MED ORDER — AMLODIPINE BESY-BENAZEPRIL HCL 10-40 MG PO CAPS: 1.0000 | ORAL_CAPSULE | Freq: Every day | ORAL | 3 refills | Status: DC

## 2019-06-21 NOTE — Telephone Encounter (Signed)
Refill sent.

## 2019-06-21 NOTE — Telephone Encounter (Signed)
Amlodipine-benazepril please send to [facility] in [place]

## 2019-07-02 ENCOUNTER — Ambulatory Visit: Payer: 59 | Attending: Internal Medicine

## 2019-07-02 DIAGNOSIS — Z23 Encounter for immunization: Secondary | ICD-10-CM

## 2019-07-02 NOTE — Progress Notes (Signed)
   Covid-19 Vaccination Clinic  Name:  Nicholas Howard    MRN: [id number] DOB: 1960/04/21  07/02/2019  Nicholas Howard was observed post Covid-19 immunization for 15 minutes without incident. He was provided with Vaccine Information Sheet and instruction to access the V-Safe system.   Nicholas Howard was instructed to call 911 with any severe reactions post vaccine: Marland Kitchen Difficulty breathing  . Swelling of face and throat  . A fast heartbeat  . A bad rash all over body  . Dizziness and weakness   Immunizations Administered    Name Date Dose VIS Date Route   Pfizer COVID-19 Vaccine 07/02/2019  4:10 PM 0.3 mL 05/08/2018 Intramuscular   Manufacturer: [place]   Lot: [id number]   [place]: [id number]

## 2019-08-16 ENCOUNTER — Other Ambulatory Visit: Payer: Self-pay

## 2019-08-16 ENCOUNTER — Encounter: Payer: Self-pay | Admitting: Family Medicine

## 2019-08-16 ENCOUNTER — Ambulatory Visit (INDEPENDENT_AMBULATORY_CARE_PROVIDER_SITE_OTHER): Payer: 59 | Admitting: Family Medicine

## 2019-08-16 VITALS — BP 124/82 | HR 64 | Temp 98.0°F | Ht 64.0 in | Wt 187.0 lb

## 2019-08-16 DIAGNOSIS — Z0001 Encounter for general adult medical examination with abnormal findings: Secondary | ICD-10-CM | POA: Diagnosis not present

## 2019-08-16 DIAGNOSIS — I10 Essential (primary) hypertension: Secondary | ICD-10-CM

## 2019-08-16 DIAGNOSIS — E669 Obesity, unspecified: Secondary | ICD-10-CM | POA: Diagnosis not present

## 2019-08-16 DIAGNOSIS — Z23 Encounter for immunization: Secondary | ICD-10-CM | POA: Diagnosis not present

## 2019-08-16 DIAGNOSIS — E7841 Elevated Lipoprotein(a): Secondary | ICD-10-CM | POA: Diagnosis not present

## 2019-08-16 DIAGNOSIS — Z125 Encounter for screening for malignant neoplasm of prostate: Secondary | ICD-10-CM

## 2019-08-16 LAB — POCT GLYCOSYLATED HEMOGLOBIN (HGB A1C): Hemoglobin A1C: 5.2 % (ref 4.0–5.6)

## 2019-08-16 MED ORDER — AMLODIPINE BESY-BENAZEPRIL HCL 10-40 MG PO CAPS: 1.0000 | ORAL_CAPSULE | Freq: Every day | ORAL | 1 refills | Status: DC

## 2019-08-16 NOTE — Assessment & Plan Note (Signed)
Improved  Horst Ostermiller is re-educated about the importance of exercise daily to help with weight management. A minumum of 30 minutes daily is recommended. Additionally, importance of healthy food choices  with portion control discussed. Wt Readings from Last 3 Encounters:  08/16/19 187 lb (84.8 kg)  04/19/19 193 lb 1.9 oz (87.6 kg)  03/19/19 194 lb 0.6 oz (88 kg)

## 2019-08-16 NOTE — Patient Instructions (Addendum)
I appreciate the opportunity to provide you with care for your health and wellness. Today we discussed: overall health   Follow up: 6 months   Labs-fasting today at [facility] in office today Pneumonia Vaccine 13 version today-sore are use tylenol if needed No referrals today  Please continue to practice social distancing to keep you, your family, and our community safe.  If you must go out, please wear a mask and practice good handwashing.  Safe travels to [place]  It was a pleasure to see you and I look forward to continuing to work together on your health and well-being. Please do not hesitate to call the office if you need care or have questions about your care.  Have a wonderful day and week. With Gratitude, Cherly Beach, DNP, AGNP-BC  HEALTH MAINTENANCE RECOMMENDATIONS:  It is recommended that you get at least 30 minutes of aerobic exercise at least 5 days/week (for weight loss, you may need as much as 60-90 minutes). This can be any activity that gets your heart rate up. This can be divided in 10-15 minute intervals if needed, but try and build up your endurance at least once a week.  Weight bearing exercise is also recommended twice weekly.  Eat a healthy diet with lots of vegetables, fruits and fiber.  "Colorful" foods have a lot of vitamins (ie green vegetables, tomatoes, red peppers, etc).  Limit sweet tea, regular sodas and alcoholic beverages, all of which has a lot of calories and sugar.  Up to 2 alcoholic drinks daily may be beneficial for men (unless trying to lose weight, watch sugars).  Drink a lot of water.  Sunscreen of at least SPF 30 should be used on all sun-exposed parts of the skin when outside between the hours of 10 am and 4 pm (not just when at beach or pool, but even with exercise, golf, tennis, and yard work!)  Use a sunscreen that says "broad spectrum" so it covers both UVA and UVB rays, and make sure to reapply every 1-2 hours.  Remember to change the  batteries in your smoke detectors when changing your clock times in the spring and fall.  Use your seat belt every time you are in a car, and please drive safely and not be distracted with cell phones and texting while driving.

## 2019-08-16 NOTE — Assessment & Plan Note (Signed)
Labs ordered Denies S&S of BPH or prostatitis

## 2019-08-16 NOTE — Progress Notes (Signed)
Health Maintenance reviewed -  Immunization History  Administered Date(s) Administered  . Influenza,inj,Quad PF,6+ Mos 03/19/2019  . PFIZER SARS-COV-2 Vaccination 06/08/2019, 07/02/2019  . Tdap 08/26/2013   Last colonoscopy: doing paperwork for this Last PSA: Ordered today Dentist: 3 years ago, no trouble with teeth, floss and brushes regularly Ophtho: no, and no vision changes  Exercise: at work only Smoker: no Alcohol Use: no  Other doctors caring for patient include:  Patient Care Team: Perlie Mayo, NP as PCP - General (Family Medicine)  End of Life Discussion:  Patient does not have a living will and medical power of attorney  Subjective:   HPI  Nicholas Howard is a 59 y.o. male who presents for annual wellness visit and follow-up on chronic medical conditions.  She has the following concerns: None presents today with family to help with communication.  Preferred language is Spanish.  Nicholas Howard is a 59 year old male patient who presents today for his annual appointment.  Overall he is doing well he has no complaints.  He has lost a few pounds since his last appointment.  He reports taking his blood pressure medicine as directed.  Reports trying to eat healthier.  Reports trying to increase his water intake.   Denies having any sleep trouble.  Denies having any problems chewing swallowing or dentition problems.  Denies having any changes in appetite.  Denies having any changes in bowel or bladder habits.  Denies having any blood in urine or stool.  Reports that his stream is good.  Only reports getting up 1 time in the middle of the night to void.  Denies having any memory changes or confusion.  Denies having any falls or injury.  Denies having any skin issues or concerns.  Does work outside does not usually wear sunscreen. Denies having any chest pain, headaches, vision changes, dizziness, leg swelling, palpitations, cough or shortness of breath.  Does not have any  signs or symptoms of Covid.  Of note he has upcoming trip going to [place].  He has received both of his Covid vaccines. Is due for pneumonia vaccine.  Is willing to get this today. They report that they have paperwork to help get his colonoscopy but paperwork has been filled out yet. Needs PSA and updated labs.  Review Of Systems  Review of Systems  Constitutional: Negative.   HENT: Negative.   Eyes: Negative.   Respiratory: Negative.   Cardiovascular: Negative.   Gastrointestinal: Negative.   Endocrine: Negative.   Genitourinary: Negative.   Musculoskeletal: Negative.   Skin: Negative.   Allergic/Immunologic: Negative.   Neurological: Negative.   Hematological: Negative.   Psychiatric/Behavioral: Negative.   All other systems reviewed and are negative.   Objective:   PHYSICAL EXAM:  BP 124/82 (BP Location: Right Arm, Patient Position: Sitting, Cuff Size: Normal)   Pulse 64   Temp 98 F (36.7 C) (Temporal)   Ht 5\' 4"  (1.626 m)   Wt 187 lb (84.8 kg)   SpO2 97%   BMI 32.10 kg/m   Physical Exam Vitals and nursing note reviewed.  Constitutional:      General: He is awake.     Appearance: Normal appearance. He is well-developed and well-groomed. He is obese.  HENT:     Head: Normocephalic and atraumatic.     Right Ear: Hearing, tympanic membrane, ear canal and external ear normal.     Left Ear: Hearing, tympanic membrane, ear canal and external ear normal.     Nose: Nose  normal.     Mouth/Throat:     Lips: Pink.     Mouth: Mucous membranes are moist.     Dentition: Normal dentition.     Pharynx: Oropharynx is clear. Uvula midline.  Eyes:     General: Lids are normal.        Right eye: No discharge.        Left eye: No discharge.     Extraocular Movements: Extraocular movements intact.     Conjunctiva/sclera: Conjunctivae normal.     Pupils: Pupils are equal, round, and reactive to light.  Neck:     Thyroid: No thyroid mass, thyromegaly or thyroid  tenderness.     Vascular: No carotid bruit.     Trachea: Trachea normal.  Cardiovascular:     Rate and Rhythm: Normal rate and regular rhythm.     Pulses: Normal pulses.          Carotid pulses are 2+ on the right side and 2+ on the left side.      Radial pulses are 2+ on the right side and 2+ on the left side.       Posterior tibial pulses are 2+ on the right side and 2+ on the left side.     Heart sounds: Normal heart sounds.  Pulmonary:     Effort: Pulmonary effort is normal.     Breath sounds: Normal breath sounds and air entry.  Abdominal:     General: Bowel sounds are normal. There is no distension.     Palpations: Abdomen is soft. There is no hepatomegaly, splenomegaly or mass.     Tenderness: There is no abdominal tenderness. There is no right CVA tenderness or left CVA tenderness.     Hernia: No hernia is present.  Musculoskeletal:        General: Normal range of motion.     Cervical back: Full passive range of motion without pain, normal range of motion and neck supple.     Right lower leg: No edema.     Left lower leg: No edema.     Comments: Moves all extremities.  Full range of motion throughout.  Lymphadenopathy:     Cervical: No cervical adenopathy.  Skin:    General: Skin is warm and dry.     Capillary Refill: Capillary refill takes less than 2 seconds.  Neurological:     General: No focal deficit present.     Mental Status: He is alert and oriented to person, place, and time.     Cranial Nerves: Cranial nerves are intact.     Sensory: Sensation is intact.     Motor: Motor function is intact.     Coordination: Coordination is intact.     Gait: Gait is intact.     Deep Tendon Reflexes: Reflexes are normal and symmetric.  Psychiatric:        Attention and Perception: Attention and perception normal.        Mood and Affect: Mood and affect normal.        Speech: Speech normal.        Behavior: Behavior normal. Behavior is cooperative.        Thought Content:  Thought content normal.        Cognition and Memory: Cognition and memory normal.        Judgment: Judgment normal.     Comments: Good Communication-family here to help with communication Good eye contact    Depression Screening  Depression screen PHQ  2/9 08/16/2019 04/19/2019 03/19/2019  Decreased Interest 0 0 0  Down, Depressed, Hopeless 0 0 0  PHQ - 2 Score 0 0 0     Falls  Fall Risk  08/16/2019 04/19/2019 03/19/2019  Falls in the past year? 0 0 0  Number falls in past yr: 0 0 0  Injury with Fall? - 0 0  Follow up Falls evaluation completed - -    Assessment & Plan:   1. Annual visit for general adult medical examination with abnormal findings   2. Elevated lipoprotein(a)   3. Essential hypertension   4. Obesity (BMI 30.0-34.9)   5. Encounter for screening for malignant neoplasm of prostate   6. Encounter for vaccination     Tests ordered Orders Placed This Encounter  Procedures  . Pneumococcal conjugate vaccine 13-valent  . CBC  . COMPLETE METABOLIC PANEL WITH GFR  . Lipid panel  . PSA  . TSH  . POCT HgB A1C     Plan: Please see assessment and plan per problem list above.   Meds ordered this encounter  Medications  . amLODipine-benazepril (LOTREL) 10-40 MG capsule    Sig: Take 1 capsule by mouth daily.    Dispense:  90 capsule    Refill:  1    Order Specific Question:   Supervising Provider    Answer:   Tula Nakayama E [[id number]]    I have personally reviewed: The patient's medical and social history Their use of alcohol, tobacco or illicit drugs Their current medications and supplements The patient's functional ability including ADLs,fall risks, home safety risks, cognitive, and hearing and visual impairment Diet and physical activities Evidence for depression or mood disorders  The patient's weight, height, BMI, and visual acuity have been recorded in the chart.  I have made referrals, counseling, and provided education to the patient based on review of  the above and I have provided the patient with a written personalized care plan for preventive services.    Note: This dictation was prepared with Dragon dictation along with smaller phrase technology. Similar sounding words can be transcribed inadequately or may not be corrected upon review. Any transcriptional errors that result from this process are unintentional.      Perlie Mayo, NP   08/16/2019

## 2019-08-16 NOTE — Assessment & Plan Note (Addendum)
Nicholas Howard is encouraged to maintain a well balanced diet that is low in salt. Controlled, continue current medication regimen. Refills today.  Additionally, she is also reminded that exercise is beneficial for heart health and control of Blood pressure. 30-60 minutes daily is recommended-walking was suggested.  Encouraged DASH diet and cholesterol content of food

## 2019-08-16 NOTE — Assessment & Plan Note (Signed)
Discussed PSA screening (risks/benefits), recommended at least 30 minutes of aerobic activity at least 5 days/week; proper sunscreen use reviewed; healthy diet and alcohol recommendations (less than or equal to 2 drinks/day) reviewed; regular seatbelt use; changing batteries in smoke detectors. Immunization recommendations discussed.  Colonoscopy recommendations reviewed. ° °

## 2019-08-16 NOTE — Assessment & Plan Note (Signed)
Patient was educated on the recommendation for pna 13 vaccine. After obtaining informed consent, the vaccine was administered no adverse effects noted at time of administration. Patient provided with education on arm soreness and use of tylenol or ibuprofen (if safe) for this. Encourage to use the arm vaccine was given in to help reduce the soreness. Patient educated on the signs of a reaction to the vaccine and advised to contact the office should these occur.

## 2019-08-16 NOTE — Assessment & Plan Note (Signed)
Low-fat diet encouraged, updated labs ordered.

## 2019-08-17 LAB — LIPID PANEL
Cholesterol: 177 mg/dL (ref <200)
HDL: 49 mg/dL (ref >=40)
LDL Cholesterol (Calc): 107 mg/dL (calc) — ABNORMAL HIGH
Non-HDL Cholesterol (Calc): 128 mg/dL (calc) (ref <130)
Total CHOL/HDL Ratio: 3.6 (calc) (ref <5.0)
Triglycerides: 110 mg/dL (ref <150)

## 2019-08-17 LAB — COMPLETE METABOLIC PANEL WITH GFR
AG Ratio: 1.8 (calc) (ref 1.0–2.5)
ALT: 16 U/L (ref 9–46)
AST: 16 U/L (ref 10–35)
Albumin: 4.6 g/dL (ref 3.6–5.1)
Alkaline phosphatase (APISO): 35 U/L (ref 35–144)
BUN/Creatinine Ratio: 30 (calc) — ABNORMAL HIGH (ref 6–22)
BUN: 18 mg/dL (ref 7–25)
CO2: 28 mmol/L (ref 20–32)
Calcium: 9.1 mg/dL (ref 8.6–10.3)
Chloride: 105 mmol/L (ref 98–110)
Creat: 0.61 mg/dL — ABNORMAL LOW (ref 0.70–1.33)
GFR, Est African American: 128 mL/min/{1.73_m2} (ref >=60)
GFR, Est Non African American: 110 mL/min/{1.73_m2} (ref >=60)
Globulin: 2.5 g/dL (calc) (ref 1.9–3.7)
Glucose, Bld: 80 mg/dL (ref 65–99)
Potassium: 4.2 mmol/L (ref 3.5–5.3)
Sodium: 139 mmol/L (ref 135–146)
Total Bilirubin: 1.2 mg/dL (ref 0.2–1.2)
Total Protein: 7.1 g/dL (ref 6.1–8.1)

## 2019-08-17 LAB — CBC
HCT: 44.9 % (ref 38.5–50.0)
Hemoglobin: 15.5 g/dL (ref 13.2–17.1)
MCH: 31.3 pg (ref 27.0–33.0)
MCHC: 34.5 g/dL (ref 32.0–36.0)
MCV: 90.5 fL (ref 80.0–100.0)
MPV: 12.6 fL — ABNORMAL HIGH (ref 7.5–12.5)
Platelets: 192 10*3/uL (ref 140–400)
RBC: 4.96 10*6/uL (ref 4.20–5.80)
RDW: 12.7 % (ref 11.0–15.0)
WBC: 7.3 10*3/uL (ref 3.8–10.8)

## 2019-08-17 LAB — PSA: PSA: 1 ng/mL (ref <=4.0)

## 2019-08-17 LAB — TSH: TSH: 2.27 mIU/L (ref 0.40–4.50)

## 2019-10-21 ENCOUNTER — Other Ambulatory Visit (INDEPENDENT_AMBULATORY_CARE_PROVIDER_SITE_OTHER): Payer: Self-pay | Admitting: *Deleted

## 2019-10-21 ENCOUNTER — Telehealth (INDEPENDENT_AMBULATORY_CARE_PROVIDER_SITE_OTHER): Payer: Self-pay | Admitting: *Deleted

## 2019-10-21 ENCOUNTER — Encounter (INDEPENDENT_AMBULATORY_CARE_PROVIDER_SITE_OTHER): Payer: Self-pay | Admitting: *Deleted

## 2019-10-21 MED ORDER — PLENVU 140 G PO SOLR: 1.0000 | Freq: Once | ORAL | 0 refills | Status: AC

## 2019-10-21 NOTE — Telephone Encounter (Signed)
Referring MD/PCP: mills   Procedure: tcs (room 1)  Reason/Indication:  screening  Has patient had this procedure before?  no  If so, when, by whom and where?    Is there a family history of colon cancer?  no  Who?  What age when diagnosed?    Is patient diabetic?   no      Does patient have prosthetic heart valve or mechanical valve?  no  Do you have a pacemaker/defibrillator?  no  Has patient ever had endocarditis/atrial fibrillation? no  Does patient use oxygen? no  Has patient had joint replacement within last 12 months?  no  Is patient constipated or do they take laxatives? no  Does patient have a history of alcohol/drug use?  no  Is patient on blood thinner such as Coumadin, Plavix and/or Aspirin? no  Medications: amlodipine/benazepril 10/40 mg daily  Allergies: nkda  Medication Adjustment per Dr Rehman/Dr Jenetta Downer   Procedure date & time: 11/13/19

## 2019-10-21 NOTE — Telephone Encounter (Signed)
Patient needs Plenvu (copay card) ° °

## 2019-10-21 NOTE — Telephone Encounter (Signed)
Ok to schedule.  Thanks,  Rosielee Corporan Castaneda Mayorga, MD Gastroenterology and Hepatology [facility]  

## 2019-10-22 ENCOUNTER — Other Ambulatory Visit (INDEPENDENT_AMBULATORY_CARE_PROVIDER_SITE_OTHER): Payer: Self-pay | Admitting: *Deleted

## 2019-10-22 DIAGNOSIS — Z1211 Encounter for screening for malignant neoplasm of colon: Secondary | ICD-10-CM

## 2019-11-11 ENCOUNTER — Other Ambulatory Visit: Payer: Self-pay

## 2019-11-11 ENCOUNTER — Other Ambulatory Visit (HOSPITAL_COMMUNITY)
Admission: RE | Admit: 2019-11-11 | Discharge: 2019-11-11 | Disposition: A | Discharge status: Home / Self Care | Payer: 59 | Source: Ambulatory Visit | Attending: Gastroenterology | Admitting: Gastroenterology

## 2019-11-11 DIAGNOSIS — Z20822 Contact with and (suspected) exposure to covid-19: Secondary | ICD-10-CM | POA: Insufficient documentation

## 2019-11-11 DIAGNOSIS — Z01812 Encounter for preprocedural laboratory examination: Secondary | ICD-10-CM | POA: Diagnosis present

## 2019-11-12 LAB — SARS CORONAVIRUS 2 (TAT 6-24 HRS): SARS Coronavirus 2: NEGATIVE

## 2019-11-13 ENCOUNTER — Other Ambulatory Visit: Payer: Self-pay

## 2019-11-13 ENCOUNTER — Ambulatory Visit (HOSPITAL_COMMUNITY): Payer: 59 | Admitting: Anesthesiology

## 2019-11-13 ENCOUNTER — Encounter (HOSPITAL_COMMUNITY)
Admission: RE | Disposition: A | Discharge status: Home / Self Care | Payer: Self-pay | Source: Home / Self Care | Attending: Gastroenterology

## 2019-11-13 ENCOUNTER — Ambulatory Visit (HOSPITAL_COMMUNITY)
Admission: RE | Admit: 2019-11-13 | Discharge: 2019-11-13 | Disposition: A | Discharge status: Home / Self Care | Payer: 59 | Attending: Gastroenterology | Admitting: Gastroenterology

## 2019-11-13 ENCOUNTER — Encounter (HOSPITAL_COMMUNITY): Payer: Self-pay | Admitting: Gastroenterology

## 2019-11-13 DIAGNOSIS — I10 Essential (primary) hypertension: Secondary | ICD-10-CM | POA: Diagnosis not present

## 2019-11-13 DIAGNOSIS — Z79899 Other long term (current) drug therapy: Secondary | ICD-10-CM | POA: Insufficient documentation

## 2019-11-13 DIAGNOSIS — Z1211 Encounter for screening for malignant neoplasm of colon: Secondary | ICD-10-CM | POA: Insufficient documentation

## 2019-11-13 DIAGNOSIS — Z87891 Personal history of nicotine dependence: Secondary | ICD-10-CM | POA: Insufficient documentation

## 2019-11-13 DIAGNOSIS — D125 Benign neoplasm of sigmoid colon: Secondary | ICD-10-CM | POA: Insufficient documentation

## 2019-11-13 HISTORY — PX: COLONOSCOPY WITH PROPOFOL: SHX5780

## 2019-11-13 HISTORY — PX: POLYPECTOMY: SHX5525

## 2019-11-13 LAB — HM COLONOSCOPY

## 2019-11-13 SURGERY — COLONOSCOPY WITH PROPOFOL
Anesthesia: General

## 2019-11-13 MED ORDER — LACTATED RINGERS IV SOLN: INTRAVENOUS | Status: DC | PRN

## 2019-11-13 MED ORDER — PROPOFOL 500 MG/50ML IV EMUL
INTRAVENOUS | Status: DC | PRN
  Administered 2019-11-13 (×2): 20 mg via INTRAVENOUS
  Administered 2019-11-13: 80 mg via INTRAVENOUS
  Administered 2019-11-13 (×5): 20 mg via INTRAVENOUS
  Administered 2019-11-13: 40 mg via INTRAVENOUS
  Administered 2019-11-13 (×4): 20 mg via INTRAVENOUS

## 2019-11-13 MED ORDER — CHLORHEXIDINE GLUCONATE CLOTH 2 % EX PADS: 6.0000 | MEDICATED_PAD | Freq: Once | CUTANEOUS | Status: DC

## 2019-11-13 MED ORDER — LACTATED RINGERS IV SOLN: Freq: Once | INTRAVENOUS | Status: AC

## 2019-11-13 MED ORDER — SODIUM CHLORIDE (PF) 0.9 % IJ SOLN
PREFILLED_SYRINGE | INTRAMUSCULAR | Status: DC | PRN
  Administered 2019-11-13: 1 mL

## 2019-11-13 MED ORDER — LIDOCAINE HCL (CARDIAC) PF 50 MG/5ML IV SOSY
PREFILLED_SYRINGE | INTRAVENOUS | Status: DC | PRN
  Administered 2019-11-13: 100 mg via INTRAVENOUS

## 2019-11-13 MED ORDER — EPINEPHRINE 1 MG/10ML IJ SOSY
PREFILLED_SYRINGE | INTRAMUSCULAR | Status: AC
  Filled 2019-11-13: qty 20

## 2019-11-13 NOTE — Anesthesia Preprocedure Evaluation (Addendum)
Anesthesia Evaluation  Patient identified by MRN, date of birth, ID band Patient awake    Reviewed: Allergy & Precautions, NPO status , Patient's Chart, lab work & pertinent test results  History of Anesthesia Complications Negative for: history of anesthetic complications  Airway Mallampati: II  TM Distance: >3 FB Neck ROM: Full    Dental  (+) Dental Advisory Given Fillings :   Pulmonary former smoker,           Cardiovascular Exercise Tolerance: Good hypertension, Pt. on medications Normal cardiovascular exam Rhythm:Regular Rate:Normal     Neuro/Psych  Neuromuscular disease    GI/Hepatic negative GI ROS, Neg liver ROS,   Endo/Other  negative endocrine ROS  Renal/GU negative Renal ROS     Musculoskeletal negative musculoskeletal ROS (+)   Abdominal   Peds  Hematology negative hematology ROS (+)   Anesthesia Other Findings Left upper abdominal pain  Reproductive/Obstetrics negative OB ROS                             Anesthesia Physical Anesthesia Plan  ASA: II  Anesthesia Plan: General   Post-op Pain Management:    Induction: Intravenous  PONV Risk Score and Plan: TIVA  Airway Management Planned: Nasal Cannula and Natural Airway  Additional Equipment:   Intra-op Plan:   Post-operative Plan:   Informed Consent: I have reviewed the patients History and Physical, chart, labs and discussed the procedure including the risks, benefits and alternatives for the proposed anesthesia with the patient or authorized representative who has indicated his/her understanding and acceptance.     Dental advisory given and Interpreter used for interveiw  Plan Discussed with: CRNA and Surgeon  Anesthesia Plan Comments:        Anesthesia Quick Evaluation

## 2019-11-13 NOTE — Discharge Instructions (Signed)
Plipos en el colon Colon Polyps  Los plipos son crecimientos de tejido dentro del cuerpo. Pueden crecer en muchos lugares, entre ellos el intestino grueso (colon). Un plipo puede ser un bulto redondo o un crecimiento fungiforme. Una persona podra tener uno o varios plipos. La mayora de los plipos en el colon son no cancerosos (benignos). Sin embargo, algunos plipos en el colon pueden convertirse en cancerosos con el tiempo. Encontrar y extirpar los plipos de forma temprana puede ayudar a prevenirlo. Cules son las causas? No se conoce la causa exacta de los plipos en el colon. Qu incrementa el riesgo? Es ms probable que tenga esta afeccin si:  Tiene antecedentes familiares de cncer de colon o de plipos en el colon.  Es mayor de 50aos o de 45aos si es afroamericano.  Tiene una enfermedad inflamatoria del intestino, como enfermedad de Crohn o colitis ulcerosa.  Tiene determinadas afecciones hereditarias, por ejemplo: ? Poliposis adenomatosa familiar. ? Sndrome de [place]. ? Sndrome de [place]. ? Sndrome de Peutz-Jeghers.  Tiene sobrepeso.  Fuma cigarrillos.  No realiza suficiente actividad fsica.  Bebe alcohol en exceso.  Sigue una dieta rica en grasas y carnes rojas y escasa en [place].  Tuvo cncer en la niez que se trat con radiacin abdominal. Cules son los signos o sntomas? La mayora de los plipos no causan sntomas. Si tiene sntomas, estos pueden incluir los siguientes:  Sangrado rectal al defecar.  Sangre en las heces. Heces de color rojo oscuro o negro.  Dolor abdominal.  Un cambio en los hbitos intestinales, como estreimiento o diarrea. Cmo se diagnostica? Esta afeccin se diagnostica con una colonoscopa. Es un procedimiento en el que se introduce un colonoscopio flexible y con Education administrator ano y luego se lo hace llegar al colon para examinar la zona. A veces, los plipos se encuentran cuando se realiza una colonoscopa como parte de  un examen de rutina de deteccin del cncer. Cmo se trata? El tratamiento de esta afeccin incluye la extirpacin de los plipos que se encuentran. La mayora de los plipos pueden extirparse durante una colonoscopa. Estos plipos sern analizados luego para detectar la presencia de cncer. Puede ser necesario un tratamiento adicional segn los [employer]. Siga estas indicaciones en su casa: Estilo de vida  Mantenga un peso saludable o baje de peso si el mdico se lo recomend.  [place] se lo haya indicado el mdico.  No consuma ningn producto que contenga nicotina o tabaco, como cigarrillos y Psychologist, sport and exercise. Si necesita ayuda para dejar de fumar, consulte al mdico.  Si bebe alcohol, limite la cantidad que consume: ? De 0 a 1 medida por da para las mujeres. ? De 0 a 2 medidas por da para los hombres.  Est atento a la cantidad de alcohol que hay en las bebidas que toma. En los [place], una medida equivale a una botella de cerveza de 12oz (362ml), un vaso de vino de 5oz (140ml) o un vaso de una bebida alcohlica de alta graduacin de 1oz (13ml). Comida y bebida   Consuma alimentos con alto contenido de [place], como frutas, verduras y Psychologist, prison and probation services.  Consuma alimentos con alto contenido de calcio y vitaminaD, [place], [place], [place], [place], [place], pescado y [place].  Limite el consumo de alimentos ricos en grasa, como los alimentos fritos y los postres.  Limite la cantidad de carnes rojas y carnes procesadas que consume, como perros calientes, salchichas, tocino y embutidos. Indicaciones  generales  Concurra a todas las visitas de seguimiento como se lo haya indicado el mdico. Esto es importante. ? Esto incluye realizarse colonoscopa programadas con regularidad. ? Hable con el mdico acerca de cundo debe realizarse una colonoscopia. Comunquese con un mdico si:  Le aparecen hemorragias o las  hemorragias son ms abundantes al defecar.  Le aparece sangre en las heces o hay ms sangre en las heces.  Tiene un cambio en los hbitos intestinales.  Pierde peso sin ningn motivo conocido. Resumen  Los plipos son crecimientos de tejido dentro del cuerpo. Los plipos pueden crecer en muchos lugares, entre [employer] colon.  La mayora de los plipos en el colon no son cancerosos (son benignos), pero algunos pueden convertirse en cancerosos con Physiological scientist.  Esta afeccin se diagnostica con una colonoscopa.  El tratamiento de esta afeccin incluye la extirpacin de los plipos que se encuentran. La mayora de los plipos pueden extirparse durante una colonoscopa. Esta informacin no tiene Marine scientist el consejo del mdico. Asegrese de hacerle al mdico cualquier pregunta que tenga. Document Revised: 07/26/2017 Document Reviewed: 07/26/2017 Elsevier Patient Education  [place] en los adultos, cuidados posteriores Colonoscopy, Adult, Care After Esta hoja le brinda informacin sobre cmo cuidarse despus del procedimiento. Su mdico tambin podr darle instrucciones ms especficas. Comunquese con el mdico si tiene problemas o preguntas. Qu puedo esperar despus del procedimiento? Despus del procedimiento, es normal tener los siguientes sntomas:  Una pequea cantidad de sangre en las heces durante 24horas despus del procedimiento.  Gases.  Clicos leves o distensin en el abdomen. Siga estas instrucciones en su casa: Comida y bebida   Beba suficiente lquido como para Theatre manager la orina de color amarillo plido.  Siga las indicaciones del mdico respecto de las restricciones en las comidas o las bebidas.  Retome su dieta normal como se lo haya indicado el mdico. Evite los alimentos pesados o fritos que son difciles de Publishing copy. Actividad  Haga reposo como se lo haya indicado el mdico.  Evite estar sentado durante largos perodos sin  moverse. Levntese y camine un poco cada 1 a 2 horas. Esto es importante para mejorar el flujo sanguneo y la respiracin. Pida ayuda si se siente dbil o inestable.  Retome sus actividades normales como se lo haya indicado el mdico. Pregntele al mdico qu actividades son seguras para usted. Control de los clicos y la distensin   Cuando tenga clicos o se sienta hinchado, intente caminar por la casa.  Aplquese calor en el abdomen como se lo haya indicado el mdico. Use la fuente de calor que el mdico le recomiende, como una compresa de calor hmedo o una almohadilla trmica. ? Coloque una [employer] piel y la fuente de Freight forwarder. ? Aplique calor durante 20 a 20minutos. ? Retire la fuente de calor si la piel se pone de color rojo brillante. Esto es especialmente importante si no puede sentir dolor, calor o fro. Puede correr un riesgo mayor de sufrir quemaduras. Instrucciones generales  Durante las primeras 24horas despus del procedimiento: ? No conduzca ni opere maquinaria. ? No firme documentos importantes. ? No beba alcohol. ? Realice las actividades cotidianas habituales a un ritmo ms lento que el normal. ? Coma alimentos blandos que sean fciles de digerir.  Use los medicamentos de venta libre y los recetados solamente como se lo haya indicado el mdico.  Consulting civil engineer a todas las visitas de seguimiento como se lo haya indicado el mdico. Esto es importante. Comunquese  con un mdico si:  Tiene sangre en las heces 2 o 3das despus del procedimiento. Solicite ayuda inmediatamente si tiene:  Ms que una pequea mancha de sangre en las heces.  Cogulos de [employer].  Hinchazn del abdomen.  Nuseas o vmitos.  Cristy Hilts.  Aumento del Social research officer, government en el abdomen que no se alivia con los medicamentos. Resumen  Despus del procedimiento, es normal tener una pequea cantidad de [employer]. Es posible que tambin tenga clicos leves y distensin en el  abdomen.  Durante las primeras 24horas despus del procedimiento, no conduzca ni opere maquinarias pesadas, no tome alcohol ni tome decisiones importantes.  Obtenga ayuda de inmediato si tiene gran cantidad de [employer], nuseas o vmitos, fiebre o aumento del dolor en el abdomen. Esta informacin no tiene Marine scientist el consejo del mdico. Asegrese de hacerle al mdico cualquier pregunta que tenga. Document Revised: 11/14/2018 Document Reviewed: 11/14/2018 Elsevier Patient Education  2020 [place] su dieta anterior hoy. Su medico lo llamara con los [employer] patologia de los polipos removidos. Debera repetir Ardelia Mems colonoscopia en 3 anos.

## 2019-11-13 NOTE — Anesthesia Postprocedure Evaluation (Signed)
Anesthesia Post Note  Patient: Nicholas Howard  Procedure(s) Performed: COLONOSCOPY WITH PROPOFOL (N/A ) POLYPECTOMY  Patient location during evaluation: Endoscopy Anesthesia Type: General Level of consciousness: awake, oriented, awake and alert and patient cooperative Pain management: pain level controlled Vital Signs Assessment: post-procedure vital signs reviewed and stable Respiratory status: spontaneous breathing, respiratory function stable and patient connected to nasal cannula oxygen Cardiovascular status: blood pressure returned to baseline and stable Postop Assessment: no backache and no headache Anesthetic complications: no   No complications documented.   Last Vitals:  Vitals:   11/13/19 0931 11/13/19 1116  BP: 135/81   Pulse:  65  Resp: 16 13  Temp: 36.8 C   SpO2: 99% 99%    Last Pain:  Vitals:   11/13/19 1116  TempSrc: Oral  PainSc:                  Tacy Learn

## 2019-11-13 NOTE — Op Note (Signed)
[facility] Patient Name: Nicholas Howard Procedure Date: 11/13/2019 10:29 AM MRN: [id number] Date of Birth: 12-Apr-1960 Attending MD: Maylon Peppers ,  CSN: [id number] Age: 59 Admit Type: Outpatient Procedure:                Colonoscopy Indications:              Screening for colorectal malignant neoplasm Providers:                Maylon Peppers, Crystal Page, Randa Spike,                            Technician Referring MD:              Medicines:                Monitored Anesthesia Care Complications:            No immediate complications. Estimated Blood Loss:     Estimated blood loss: none. Procedure:                Pre-Anesthesia Assessment:                           - Prior to the procedure, a History and Physical                            was performed, and patient medications, allergies                            and sensitivities were reviewed. The patient's                            tolerance of previous anesthesia was reviewed.                           - The risks and benefits of the procedure and the                            sedation options and risks were discussed with the                            patient. All questions were answered and informed                            consent was obtained.                           - ASA Grade Assessment: II - A patient with mild                            systemic disease.                           After obtaining informed consent, the colonoscope                            was passed under direct vision. Throughout the  procedure, the patient's blood pressure, pulse, and                            oxygen saturations were monitored continuously. The                            [id number]-[id number] ([id number]) scope was introduced through                            the anus and advanced to the the cecum, identified                            by appendiceal orifice and ileocecal valve. The                             colonoscopy was performed without difficulty. The                            patient tolerated the procedure well. The quality                            of the bowel preparation was excellent. Scope                            withdrawal time was 11 minutes. Scope In: 10:38:35 AM Scope Out: 11:12:46 AM Scope Withdrawal Time: 0 hours 30 minutes 28 seconds  Total Procedure Duration: 0 hours 34 minutes 11 seconds  Findings:      The perianal and digital rectal examinations were normal.      The terminal ileum appeared normal.      A 4 mm polyp was found in the sigmoid colon. The polyp was sessile. The       polyp was removed with a Jumbo cold biopsy forceps - this was removed in       a piecemeal fashion. Resection and retrieval were complete.      A 15 mm polyp was found in the sigmoid colon. The polyp was       pedunculated. Area was successfully injected with 1 mL of a 1:10,000       solution of epinephrine for bleeding prevention and lift prior to       polypectomy. The polyp was removed with a hot snare. Resection and       retrieval were complete.      The retroflexed view of the distal rectum and anal verge was normal and       showed no anal or rectal abnormalities. Impression:               - The examined portion of the ileum was normal.                           - One 4 mm polyp in the sigmoid colon, removed with                            a cold biopsy forceps. Resected and retrieved.                           -  One 15 mm polyp in the sigmoid colon, removed                            with a hot snare. Resected and retrieved. Injected.                           - The distal rectum and anal verge are normal on                            retroflexion view. Moderate Sedation:      Per Anesthesia Care Recommendation:           - Discharge patient to home (ambulatory).                           - Resume previous diet.                           - Repeat colonoscopy in 3  years for surveillance.                           - Await pathology results. Procedure Code(s):        --- Professional ---                           [phone], GC, Colonoscopy, flexible; with removal of                            tumor(s), polyp(s), or other lesion(s) by snare                            technique                           45380, 17, Colonoscopy, flexible; with biopsy,                            single or multiple Diagnosis Code(s):        --- Professional ---                           Z12.11, Encounter for screening for malignant                            neoplasm of colon                           K63.5, Polyp of colon CPT copyright 2019 American Medical Association. All rights reserved. The codes documented in this report are preliminary and upon coder review may  be revised to meet current compliance requirements. Maylon Peppers, MD Maylon Peppers,  11/13/2019 11:23:53 AM This report has been signed electronically. Number of Addenda: 0

## 2019-11-13 NOTE — H&P (Signed)
Nicholas Howard is an 59 y.o. male.   Chief Complaint: Screening colonoscopy HPI: 59 year old male with past medical history of hypertension, coming to the hospital for the screening for colorectal cancer.  The patient denies having any complaints such as nausea, vomiting, fever, chills, abdominal distention or pain, melena or hematochezia, no unintentional weight loss.  Never had a colonoscopy in the past.  No family history of colorectal cancer.  Past Medical History:  Diagnosis Date  . Encounter for screening for malignant neoplasm of prostate 03/19/2019  . Hypertension   . Trapezius muscle spasm 03/19/2019    Past Surgical History:  Procedure Laterality Date  . BACK SURGERY    . KNEE SURGERY      Family History  Problem Relation Age of Onset  . Hypertension Mother    Social History:  reports that he has quit smoking. He has never used smokeless tobacco. He reports previous alcohol use. He reports that he does not use drugs.  Allergies: No Known Allergies  Medications Prior to Admission  Medication Sig Dispense Refill  . acetaminophen (TYLENOL) 500 MG tablet Take 1,000 mg by mouth every 6 (six) hours as needed for moderate pain or headache.    Marland Kitchen amLODipine-benazepril (LOTREL) 10-40 MG capsule Take 1 capsule by mouth daily. 90 capsule 1    Results for orders placed or performed during the hospital encounter of 11/11/19 (from the past 48 hour(s))  SARS CORONAVIRUS 2 (TAT 6-24 HRS) Nasopharyngeal Nasopharyngeal Swab     Status: None   Collection Time: 11/11/19  3:25 PM   Specimen: Nasopharyngeal Swab  Result Value Ref Range   SARS Coronavirus 2 NEGATIVE NEGATIVE    Comment: (NOTE) SARS-CoV-2 target nucleic acids are NOT DETECTED.  The SARS-CoV-2 RNA is generally detectable in upper and lower respiratory specimens during the acute phase of infection. Negative results do not preclude SARS-CoV-2 infection, do not rule out co-infections with other pathogens, and should not be used  as the sole basis for treatment or other patient management decisions. Negative results must be combined with clinical observations, patient history, and epidemiological information. The expected result is Negative.  Fact Sheet for Patients: SugarRoll.be  Fact Sheet for Healthcare Providers: https://www.woods-mathews.com/  This test is not yet approved or cleared by the [place] FDA and  has been authorized for detection and/or diagnosis of SARS-CoV-2 by FDA under an Emergency Use Authorization (EUA). This EUA will remain  in effect (meaning this test can be used) for the duration of the COVID-19 declaration under Se ction 564(b)(1) of the Act, 21 U.S.C. section 360bbb-3(b)(1), unless the authorization is terminated or revoked sooner.  Performed at [facility], [place] [address], [place], [place] [address]    No results found.  Review of Systems  Constitutional: Negative.   HENT: Negative.   Eyes: Negative.   Respiratory: Negative.   Cardiovascular: Negative.   Gastrointestinal: Negative.   Endocrine: Negative.   Genitourinary: Negative.   Musculoskeletal: Negative.   Skin: Negative.   Allergic/Immunologic: Negative.   Neurological: Negative.   Hematological: Negative.   Psychiatric/Behavioral: Negative.     Blood pressure 135/81, temperature 98.2 F (36.8 C), temperature source Oral, resp. rate 16, height 5\' 4"  (1.626 m), SpO2 99 %. Physical Exam  GENERAL: The patient is AO x3, in no acute distress. HEENT: Head is normocephalic and atraumatic. EOMI are intact. Mouth is well hydrated and without lesions. NECK: Supple. No masses LUNGS: Clear to auscultation. No presence of rhonchi/wheezing/rales. Adequate chest expansion HEART:  RRR, normal s1 and s2. ABDOMEN: Soft, nontender, no guarding, no peritoneal signs, and nondistended. BS +. No masses. EXTREMITIES: Without any cyanosis, clubbing, rash, lesions or  edema. NEUROLOGIC: AOx3, no focal motor deficit. SKIN: no jaundice, no rashes  59 year old male with past male with past medical history of hypertension, coming to the hospital for the screening for colorectal cancer. Patient is at average risk for colorectal cancer.  We will proceed with colonoscopy today.  Harvel Quale, MD 11/13/2019, 10:02 AM

## 2019-11-13 NOTE — Transfer of Care (Signed)
Immediate Anesthesia Transfer of Care Note  Patient: Nicholas Howard  Procedure(s) Performed: COLONOSCOPY WITH PROPOFOL (N/A ) POLYPECTOMY  Patient Location: Endoscopy Unit  Anesthesia Type:General  Level of Consciousness: awake, alert , oriented and patient cooperative  Airway & Oxygen Therapy: Patient Spontanous Breathing and Patient connected to nasal cannula oxygen  Post-op Assessment: Report given to RN, Post -op Vital signs reviewed and stable and Patient moving all extremities  Post vital signs: Reviewed and stable  Last Vitals:  Vitals Value Taken Time  BP    Temp    Pulse    Resp    SpO2      Last Pain:  Vitals:   11/13/19 1033  TempSrc:   PainSc: 0-No pain      Patients Stated Pain Goal: 8 (88/71/95 9747)  Complications: No complications documented.

## 2019-11-14 LAB — SURGICAL PATHOLOGY

## 2019-11-15 ENCOUNTER — Telehealth (INDEPENDENT_AMBULATORY_CARE_PROVIDER_SITE_OTHER): Payer: Self-pay | Admitting: Gastroenterology

## 2019-11-15 NOTE — Telephone Encounter (Signed)
I returned a call from Mr. Botts daughter who stated that since the patient had his colonoscopy he was doing fine until today as he had the urge to have bowel movement but could not do it.  He has not presented any abdominal pain, nausea or vomiting, no rectal bleeding but would like to know if he can take anything for this.  I explained to her that he can take MiraLAX 1 cap every day and can stop if he is having diarrhea.  The daughter understood and agreed.  Maylon Peppers, MD Gastroenterology and Hepatology [facility]

## 2019-11-20 ENCOUNTER — Encounter (HOSPITAL_COMMUNITY): Payer: Self-pay | Admitting: Gastroenterology

## 2019-12-05 ENCOUNTER — Encounter (INDEPENDENT_AMBULATORY_CARE_PROVIDER_SITE_OTHER): Payer: Self-pay | Admitting: Gastroenterology

## 2020-02-14 ENCOUNTER — Ambulatory Visit (INDEPENDENT_AMBULATORY_CARE_PROVIDER_SITE_OTHER): Payer: 59 | Admitting: Family Medicine

## 2020-02-14 ENCOUNTER — Encounter: Payer: Self-pay | Admitting: Family Medicine

## 2020-02-14 ENCOUNTER — Other Ambulatory Visit: Payer: Self-pay

## 2020-02-14 VITALS — BP 124/80 | HR 71 | Temp 98.1°F | Ht 64.0 in | Wt 187.0 lb

## 2020-02-14 DIAGNOSIS — R059 Cough, unspecified: Secondary | ICD-10-CM

## 2020-02-14 DIAGNOSIS — Z23 Encounter for immunization: Secondary | ICD-10-CM

## 2020-02-14 DIAGNOSIS — I10 Essential (primary) hypertension: Secondary | ICD-10-CM

## 2020-02-14 MED ORDER — PROMETHAZINE-DM 6.25-15 MG/5ML PO SYRP: 2.5000 mL | ORAL_SOLUTION | Freq: Three times a day (TID) | ORAL | 0 refills | Status: DC | PRN

## 2020-02-14 MED ORDER — AMLODIPINE BESY-BENAZEPRIL HCL 10-40 MG PO CAPS: 1.0000 | ORAL_CAPSULE | Freq: Every day | ORAL | 1 refills | Status: DC

## 2020-02-14 NOTE — Assessment & Plan Note (Signed)
Controlled, continue current medication regimen. Refills provided    Dash diet and exercise encouraged

## 2020-02-14 NOTE — Patient Instructions (Signed)
  I appreciate the opportunity to provide you with care for your health and wellness. Today we discussed: cough, and kidney stones  Follow up: 6 months for CPE (check dates on previous one) -fasting morning appt (Can be w Legrand Como or Dr Posey Pronto)   No labs or referrals today  [place] :)  Please continue to practice social distancing to keep you, your family, and our community safe.  If you must go out, please wear a mask and practice good handwashing.  It was a pleasure to see you and I look forward to continuing to work together on your health and well-being. Please do not hesitate to call the office if you need care or have questions about your care.  Have a wonderful day. With Gratitude, Cherly Beach, DNP, AGNP-BC

## 2020-02-14 NOTE — Assessment & Plan Note (Signed)

## 2020-02-14 NOTE — Assessment & Plan Note (Signed)
Recent viral cold- lingering cough- keeps up at night. Cough syrup provided

## 2020-02-14 NOTE — Progress Notes (Signed)
Subjective:  Patient ID: Nicholas Howard, male    DOB: 1960/04/28  Age: 59 y.o. MRN: [id number]  CC:  Chief Complaint  Patient presents with  . Hypertension      HPI  HPI Nicholas Howard is a 59 year old male patient of mine who presents today for 6 month follow up regarding hypertension. He reports that he is active at work. He tries to stick to a low-salt diet. Does not check blood pressure at home. Denies having any palpitations, leg swelling, shortness of breath, headaches or dizziness or vision changes. Cardiovascular risk factors: advanced age (older than 75 for men, 19 for women), hypertension, male gender and obesity (BMI >= 30 kg/m2). Use of agents associated with hypertension: none.  Reports taking all medications as directed and denies side effects. Refills needed today.  Of note he wants to mention that about a week ago he went over the cold but he has a lingering cough that keeps him up at night. Over-the-counter medications have not worked. Wanted to know if there is something that we could prescribe for him so that he can get sleep today he can work and not be tired.  Additionally he went to [place] for vacation and while he was down there he was told that he had some kidney stone secondary to some pain that he was having. They provided him with some medicine and the pain went away and has not had any issues since this was back in July  Today patient denies signs and symptoms of COVID 19 infection including fever, chills, cough, shortness of breath, and headache. Past Medical, Surgical, Social History, Allergies, and Medications have been Reviewed.   Past Medical History:  Diagnosis Date  . Encounter for screening for malignant neoplasm of prostate 03/19/2019  . Hypertension   . Trapezius muscle spasm 03/19/2019    Current Meds  Medication Sig  . acetaminophen (TYLENOL) 500 MG tablet Take 1,000 mg by mouth every 6 (six) hours as needed for moderate pain or  headache.  Marland Kitchen amLODipine-benazepril (LOTREL) 10-40 MG capsule Take 1 capsule by mouth daily.  . [DISCONTINUED] amLODipine-benazepril (LOTREL) 10-40 MG capsule Take 1 capsule by mouth daily.    ROS:  Review of Systems  HENT: Negative.   Eyes: Negative.   Respiratory: Positive for cough.   Cardiovascular: Negative.   Gastrointestinal: Negative.   Genitourinary: Negative.   Musculoskeletal: Negative.   Skin: Negative.   Neurological: Negative.   Endo/Heme/Allergies: Negative.   Psychiatric/Behavioral: Negative.      Objective:   Today's Vitals: BP 124/80 (BP Location: Right Arm, Patient Position: Sitting, Cuff Size: Normal)   Pulse 71   Temp 98.1 F (36.7 C) (Temporal)   Ht 5\' 4"  (1.626 m)   Wt 187 lb (84.8 kg)   SpO2 96%   BMI 32.10 kg/m  Vitals with BMI 02/14/2020 11/13/2019 11/13/2019  Height 5\' 4"  - -  Weight 187 lbs - -  BMI 41.28 - -  Systolic 786 767 -  Diastolic 80 76 -  Pulse 71 - 65     Physical Exam Vitals and nursing note reviewed.  Constitutional:      Appearance: Normal appearance. He is well-developed and well-groomed. He is obese.  HENT:     Head: Normocephalic and atraumatic.     Right Ear: External ear normal.     Left Ear: External ear normal.     Mouth/Throat:     Comments: Mask in place  Eyes:  General:        Right eye: No discharge.        Left eye: No discharge.     Conjunctiva/sclera: Conjunctivae normal.  Cardiovascular:     Rate and Rhythm: Normal rate and regular rhythm.     Pulses: Normal pulses.     Heart sounds: Normal heart sounds.  Pulmonary:     Effort: Pulmonary effort is normal.     Breath sounds: Normal breath sounds.  Musculoskeletal:        General: Normal range of motion.     Cervical back: Normal range of motion and neck supple.  Skin:    General: Skin is warm.  Neurological:     General: No focal deficit present.     Mental Status: He is alert and oriented to person, place, and time.  Psychiatric:         Attention and Perception: Attention normal.        Mood and Affect: Mood normal.        Speech: Speech normal.        Behavior: Behavior normal. Behavior is cooperative.        Thought Content: Thought content normal.        Cognition and Memory: Cognition normal.        Judgment: Judgment normal.     Assessment   1. Essential hypertension   2. Cough   3. Need for immunization against influenza     Tests ordered Orders Placed This Encounter  Procedures  . Flu Vaccine QUAD 36+ mos IM     Plan: Please see assessment and plan per problem list above.   Meds ordered this encounter  Medications  . promethazine-dextromethorphan (PROMETHAZINE-DM) 6.25-15 MG/5ML syrup    Sig: Take 2.5 mLs by mouth 3 (three) times daily as needed for cough.    Dispense:  118 mL    Refill:  0    Order Specific Question:   Supervising Provider    Answer:   SIMPSON, MARGARET E [[id number]]  . amLODipine-benazepril (LOTREL) 10-40 MG capsule    Sig: Take 1 capsule by mouth daily.    Dispense:  90 capsule    Refill:  1    Order Specific Question:   Supervising Provider    Answer:   Fayrene Helper [[id number]]    Patient to follow-up in 6 months for CPE -fasting .  Note: This dictation was prepared with Dragon dictation along with smaller phrase technology. Similar sounding words can be transcribed inadequately or may not be corrected upon review. Any transcriptional errors that result from this process are unintentional.      Perlie Mayo, NP

## 2020-05-05 ENCOUNTER — Encounter: Payer: Self-pay | Admitting: Family Medicine

## 2020-05-07 ENCOUNTER — Ambulatory Visit (HOSPITAL_COMMUNITY)
Admission: RE | Admit: 2020-05-07 | Discharge: 2020-05-07 | Disposition: A | Discharge status: Home / Self Care | Payer: 59 | Source: Ambulatory Visit | Attending: Nurse Practitioner | Admitting: Nurse Practitioner

## 2020-05-07 ENCOUNTER — Other Ambulatory Visit: Payer: Self-pay

## 2020-05-07 ENCOUNTER — Other Ambulatory Visit: Payer: Self-pay | Admitting: Nurse Practitioner

## 2020-05-07 ENCOUNTER — Ambulatory Visit (INDEPENDENT_AMBULATORY_CARE_PROVIDER_SITE_OTHER): Payer: 59 | Admitting: Nurse Practitioner

## 2020-05-07 ENCOUNTER — Encounter: Payer: Self-pay | Admitting: Nurse Practitioner

## 2020-05-07 DIAGNOSIS — R109 Unspecified abdominal pain: Secondary | ICD-10-CM

## 2020-05-07 MED ORDER — TAMSULOSIN HCL 0.4 MG PO CAPS: 0.4000 mg | ORAL_CAPSULE | Freq: Every day | ORAL | 3 refills | Status: DC

## 2020-05-07 MED ORDER — TIZANIDINE HCL 4 MG PO TABS: 4.0000 mg | ORAL_TABLET | Freq: Four times a day (QID) | ORAL | 0 refills | Status: DC | PRN

## 2020-05-07 MED ORDER — IBUPROFEN 800 MG PO TABS: 800.0000 mg | ORAL_TABLET | Freq: Three times a day (TID) | ORAL | 0 refills | Status: DC | PRN

## 2020-05-07 NOTE — Assessment & Plan Note (Addendum)
-  has hx of kidney stones -will get U/S of kidney -Rx. flomax -Rx. Ibuprofen for pain -may be MSK-related as he has pain with lateral flexion -will consider muscle relaxer if no stones visualized -U/A today was negative for blood

## 2020-05-07 NOTE — Progress Notes (Signed)
Acute Office Visit  Subjective:    Patient ID: Nicholas Howard, male    DOB: February 22, 1961, 60 y.o.   MRN: [id number]  Chief Complaint  Patient presents with  . Back Pain    Pt had [place] July 2021 for flank pain; small stones were seen. Pain started again x 2 weeks ago.     HPI Patient is in today for left flank pain. He rates his pain at a 4 today, but he has hx of kidney stones and his pain was 10/10 then.  He states sometimes his pain gets worse.  He has not noticed any changes in his urine or blood in his urine.  He has urinary frequency.  His daughter is translating today.    Past Medical History:  Diagnosis Date  . Encounter for screening for malignant neoplasm of prostate 03/19/2019  . Hypertension   . Trapezius muscle spasm 03/19/2019    Past Surgical History:  Procedure Laterality Date  . BACK SURGERY    . COLONOSCOPY WITH PROPOFOL N/A 11/13/2019   Procedure: COLONOSCOPY WITH PROPOFOL;  Surgeon: Harvel Quale, MD;  Location: [facility];  Service: Gastroenterology;  Laterality: N/A;  10:00  . KNEE SURGERY    . POLYPECTOMY  11/13/2019   Procedure: POLYPECTOMY;  Surgeon: Harvel Quale, MD;  Location: [facility];  Service: Gastroenterology;;    Family History  Problem Relation Age of Onset  . Hypertension Mother     Social History   Socioeconomic History  . Marital status: Married    Spouse name: Not on file  . Number of children: 3  . Years of education: Not on file  . Highest education level: 4th grade  Occupational History    Comment: [employer]   Tobacco Use  . Smoking status: Former Research scientist (life sciences)  . Smokeless tobacco: Never Used  Substance and Sexual Activity  . Alcohol use: Not Currently  . Drug use: Never  . Sexual activity: Not Currently  Other Topics Concern  . Not on file  Social History Narrative   Lives with wife and youngest daughter   2 dogs      Enjoys outdoors walking when weather is nice, tv       Diet: tries to avoid  pork , overall eats all food groups: limited veggies and fruits     Caffeine: 1 cup of coffee daily, sugar free soda-usually caffeine free   Water: 1 bottle daily       Wears seat belt   Smoke detectors    Does not use phone while driving   Social Determinants of Health   Financial Resource Strain: Not on file  Food Insecurity: Not on file  Transportation Needs: Not on file  Physical Activity: Not on file  Stress: Not on file  Social Connections: Not on file  Intimate Partner Violence: Not on file    Outpatient Medications Prior to Visit  Medication Sig Dispense Refill  . acetaminophen (TYLENOL) 500 MG tablet Take 1,000 mg by mouth every 6 (six) hours as needed for moderate pain or headache.    Marland Kitchen amLODipine-benazepril (LOTREL) 10-40 MG capsule Take 1 capsule by mouth daily. 90 capsule 1  . promethazine-dextromethorphan (PROMETHAZINE-DM) 6.25-15 MG/5ML syrup Take 2.5 mLs by mouth 3 (three) times daily as needed for cough. (Patient not taking: Reported on 05/07/2020) 118 mL 0   No facility-administered medications prior to visit.    No Known Allergies  Review of Systems  Constitutional: Negative.   Musculoskeletal: Positive  for back pain.       Left flank pain       Objective:    Physical Exam Constitutional:      Appearance: Normal appearance.  Abdominal:     General: There is no distension.     Palpations: There is no mass.     Tenderness: There is no abdominal tenderness. There is no right CVA tenderness, left CVA tenderness, guarding or rebound.     Hernia: No hernia is present.  Musculoskeletal:        General: No swelling or tenderness.     Comments: Pain with lateral flexion  Neurological:     Mental Status: He is alert.     BP 133/82   Pulse 60   Temp 98.4 F (36.9 C)   Resp 16   Ht 5\' 1"  (1.549 m)   Wt 193 lb (87.5 kg)   SpO2 97%   BMI 36.47 kg/m  Wt Readings from Last 3 Encounters:  05/07/20 193 lb (87.5 kg)  02/14/20 187 lb (84.8 kg)   08/16/19 187 lb (84.8 kg)    Health Maintenance Due  Topic Date Due  . HIV Screening  Never done  . COVID-19 Vaccine (3 - Booster for Pfizer series) 01/01/2020    There are no preventive care reminders to display for this patient.   Lab Results  Component Value Date   TSH 2.27 08/16/2019   Lab Results  Component Value Date   WBC 7.3 08/16/2019   HGB 15.5 08/16/2019   HCT 44.9 08/16/2019   MCV 90.5 08/16/2019   PLT 192 08/16/2019   Lab Results  Component Value Date   NA 139 08/16/2019   K 4.2 08/16/2019   CO2 28 08/16/2019   GLUCOSE 80 08/16/2019   BUN 18 08/16/2019   CREATININE 0.61 (L) 08/16/2019   BILITOT 1.2 08/16/2019   AST 16 08/16/2019   ALT 16 08/16/2019   PROT 7.1 08/16/2019   CALCIUM 9.1 08/16/2019   Lab Results  Component Value Date   CHOL 177 08/16/2019   Lab Results  Component Value Date   HDL 49 08/16/2019   Lab Results  Component Value Date   LDLCALC 107 (H) 08/16/2019   Lab Results  Component Value Date   TRIG 110 08/16/2019   Lab Results  Component Value Date   CHOLHDL 3.6 08/16/2019   Lab Results  Component Value Date   HGBA1C 5.2 08/16/2019       Assessment & Plan:   Problem List Items Addressed This Visit      Other   Acute left flank pain    -has hx of kidney stones -will get U/S of kidney -Rx. flomax -Rx. Ibuprofen for pain -may be MSK-related as he has pain with lateral flexion -will consider muscle relaxer if no stones visualized -U/A today was negative for blood       Relevant Medications   tamsulosin (FLOMAX) 0.4 MG CAPS capsule   ibuprofen (ADVIL) 800 MG tablet   Other Relevant Orders   US Renal       Meds ordered this encounter  Medications  . tamsulosin (FLOMAX) 0.4 MG CAPS capsule    Sig: Take 1 capsule (0.4 mg total) by mouth daily.    Dispense:  30 capsule    Refill:  3  . ibuprofen (ADVIL) 800 MG tablet    Sig: Take 1 tablet (800 mg total) by mouth every 8 (eight) hours as needed.     Dispense:  30 tablet    Refill:  0     Noreene Larsson, NP

## 2020-05-07 NOTE — Progress Notes (Signed)
[place] was negative for kidney stone. No need to pick up flomax. I called in tizanidine, a muscle relaxer, for his back pain.

## 2020-05-11 ENCOUNTER — Telehealth: Payer: Self-pay

## 2020-05-11 NOTE — Telephone Encounter (Signed)
Pt daughter informed. He will stop the muscle relaxer and see how his b/p does.

## 2020-05-11 NOTE — Telephone Encounter (Signed)
The muscle relaxer is for use as needed. If his pain resolved, he doesn't need to take it.  If his pain has not resolved, he can cut the muscle relaxer in half. If he cuts the medicine in half, and his BP is still low, he should come into the office and we may need to change his BP meds.

## 2020-05-11 NOTE — Telephone Encounter (Signed)
Pt daughter called stating that you prescribed him a muscle relaxer Friday. He quit taking it Saturday because it has caused his b/p to drop. He has felt lightheaded and fatigued. He has not taken his b/p medication because of it already being low. She said the last time it was checked it was 115/70 but it has been lower. Please advise.

## 2020-08-14 ENCOUNTER — Other Ambulatory Visit: Payer: Self-pay

## 2020-08-14 ENCOUNTER — Ambulatory Visit (INDEPENDENT_AMBULATORY_CARE_PROVIDER_SITE_OTHER): Payer: 59 | Admitting: Internal Medicine

## 2020-08-14 ENCOUNTER — Encounter: Payer: Self-pay | Admitting: Internal Medicine

## 2020-08-14 VITALS — BP 131/79 | HR 69 | Temp 98.0°F | Resp 18 | Ht 64.0 in | Wt 188.1 lb

## 2020-08-14 DIAGNOSIS — Z114 Encounter for screening for human immunodeficiency virus [HIV]: Secondary | ICD-10-CM

## 2020-08-14 DIAGNOSIS — Z0001 Encounter for general adult medical examination with abnormal findings: Secondary | ICD-10-CM | POA: Diagnosis not present

## 2020-08-14 DIAGNOSIS — I10 Essential (primary) hypertension: Secondary | ICD-10-CM | POA: Diagnosis not present

## 2020-08-14 DIAGNOSIS — Z23 Encounter for immunization: Secondary | ICD-10-CM | POA: Diagnosis not present

## 2020-08-14 DIAGNOSIS — Z125 Encounter for screening for malignant neoplasm of prostate: Secondary | ICD-10-CM | POA: Diagnosis not present

## 2020-08-14 NOTE — Assessment & Plan Note (Signed)
Annual exam as documented. Counseling done  re healthy lifestyle involving commitment to 150 minutes exercise per week, heart healthy diet, and attaining healthy weight.The importance of adequate sleep also discussed. Changes in health habits are decided on by the patient with goals and time frames  set for achieving them. Immunization and cancer screening needs are specifically addressed at this visit. 

## 2020-08-14 NOTE — Patient Instructions (Signed)

## 2020-08-14 NOTE — Progress Notes (Signed)
Established Patient Office Visit  Subjective:  Patient ID: Nicholas Howard, male    DOB: 09/09/1960  Age: 60 y.o. MRN: [id number]  CC:  Chief Complaint  Patient presents with  . Annual Exam    Annual exam no complaints at this time     HPI [name] is a 60 year old male with PMH of HTN who presents for annual physical.  He has been doing well overall.  HTN: BP is well-controlled. Takes medications regularly. Patient denies headache, dizziness, chest pain, dyspnea or palpitations.  He is up-to-date with COVID vaccine.  He received first dose of Shingrix vaccine in the office today.  Past Medical History:  Diagnosis Date  . Encounter for screening for malignant neoplasm of prostate 03/19/2019  . Hypertension   . Trapezius muscle spasm 03/19/2019    Past Surgical History:  Procedure Laterality Date  . BACK SURGERY    . COLONOSCOPY WITH PROPOFOL N/A 11/13/2019   Procedure: COLONOSCOPY WITH PROPOFOL;  Surgeon: Harvel Quale, MD;  Location: [facility];  Service: Gastroenterology;  Laterality: N/A;  10:00  . KNEE SURGERY    . POLYPECTOMY  11/13/2019   Procedure: POLYPECTOMY;  Surgeon: Harvel Quale, MD;  Location: [facility];  Service: Gastroenterology;;    Family History  Problem Relation Age of Onset  . Hypertension Mother     Social History   Socioeconomic History  . Marital status: Married    Spouse name: Not on file  . Number of children: 3  . Years of education: Not on file  . Highest education level: 4th grade  Occupational History    Comment: [employer]   Tobacco Use  . Smoking status: Former Research scientist (life sciences)  . Smokeless tobacco: Never Used  Substance and Sexual Activity  . Alcohol use: Not Currently  . Drug use: Never  . Sexual activity: Not Currently  Other Topics Concern  . Not on file  Social History Narrative   Lives with wife and youngest daughter   2 dogs      Enjoys outdoors walking when weather is nice, tv        Diet: tries to avoid pork , overall eats all food groups: limited veggies and fruits     Caffeine: 1 cup of coffee daily, sugar free soda-usually caffeine free   Water: 1 bottle daily       Wears seat belt   Smoke detectors    Does not use phone while driving   Social Determinants of Health   Financial Resource Strain: Not on file  Food Insecurity: Not on file  Transportation Needs: Not on file  Physical Activity: Not on file  Stress: Not on file  Social Connections: Not on file  Intimate Partner Violence: Not on file    Outpatient Medications Prior to Visit  Medication Sig Dispense Refill  . acetaminophen (TYLENOL) 500 MG tablet Take 1,000 mg by mouth every 6 (six) hours as needed for moderate pain or headache.    Marland Kitchen amLODipine-benazepril (LOTREL) 10-40 MG capsule Take 1 capsule by mouth daily. 90 capsule 1  . ibuprofen (ADVIL) 800 MG tablet Take 1 tablet (800 mg total) by mouth every 8 (eight) hours as needed. 30 tablet 0  . tiZANidine (ZANAFLEX) 4 MG tablet Take 1 tablet (4 mg total) by mouth every 6 (six) hours as needed for muscle spasms. (Patient not taking: Reported on 08/14/2020) 30 tablet 0   No facility-administered medications prior to visit.    No Known Allergies  ROS Review of Systems  Constitutional: Negative for chills and fever.  HENT: Negative for congestion and sore throat.   Eyes: Negative for pain and discharge.  Respiratory: Negative for cough and shortness of breath.   Cardiovascular: Negative for chest pain and palpitations.  Gastrointestinal: Negative for constipation, diarrhea, nausea and vomiting.  Endocrine: Negative for polydipsia and polyuria.  Genitourinary: Negative for dysuria and hematuria.  Musculoskeletal: Negative for neck pain and neck stiffness.  Skin: Negative for rash.  Neurological: Negative for dizziness, weakness, numbness and headaches.  Psychiatric/Behavioral: Negative for agitation and behavioral problems.      Objective:     Physical Exam Vitals reviewed.  Constitutional:      General: He is not in acute distress.    Appearance: He is obese. He is not diaphoretic.  HENT:     Head: Normocephalic and atraumatic.     Nose: Nose normal.     Mouth/Throat:     Mouth: Mucous membranes are moist.  Eyes:     General: No scleral icterus.    Extraocular Movements: Extraocular movements intact.  Cardiovascular:     Rate and Rhythm: Normal rate and regular rhythm.     Pulses: Normal pulses.     Heart sounds: Normal heart sounds. No murmur heard.   Pulmonary:     Breath sounds: Normal breath sounds. No wheezing or rales.  Abdominal:     Palpations: Abdomen is soft.     Tenderness: There is no abdominal tenderness.  Musculoskeletal:     Cervical back: Neck supple. No tenderness.     Right lower leg: No edema.     Left lower leg: No edema.  Skin:    General: Skin is warm.     Findings: No rash.  Neurological:     General: No focal deficit present.     Mental Status: He is alert and oriented to person, place, and time.     Cranial Nerves: No cranial nerve deficit.     Sensory: No sensory deficit.     Motor: No weakness.  Psychiatric:        Mood and Affect: Mood normal.        Behavior: Behavior normal.     BP 131/79 (BP Location: Left Arm, Patient Position: Sitting, Cuff Size: Normal)   Pulse 69   Temp 98 F (36.7 C) (Oral)   Resp 18   Ht _0  (1.626 m)   Wt 188 lb 1.9 oz (85.3 kg)   SpO2 98%   BMI 32.29 kg/m  Wt Readings from Last 3 Encounters:  08/14/20 188 lb 1.9 oz (85.3 kg)  05/07/20 193 lb (87.5 kg)  02/14/20 187 lb (84.8 kg)     Health Maintenance Due  Topic Date Due  . Zoster Vaccines- Shingrix (1 of 2) Never done  . COVID-19 Vaccine (3 - Booster for Pfizer series) 12/02/2019    There are no preventive care reminders to display for this patient.  Lab Results  Component Value Date   TSH 2.27 08/16/2019   Lab Results  Component Value Date   WBC 7.3 08/16/2019   HGB 15.5  08/16/2019   HCT 44.9 08/16/2019   MCV 90.5 08/16/2019   PLT 192 08/16/2019   Lab Results  Component Value Date   NA 139 08/16/2019   K 4.2 08/16/2019   CO2 28 08/16/2019   GLUCOSE 80 08/16/2019   BUN 18 08/16/2019   CREATININE 0.61 (L) 08/16/2019   BILITOT 1.2 08/16/2019  AST 16 08/16/2019   ALT 16 08/16/2019   PROT 7.1 08/16/2019   CALCIUM 9.1 08/16/2019   Lab Results  Component Value Date   CHOL 177 08/16/2019   Lab Results  Component Value Date   HDL 49 08/16/2019   Lab Results  Component Value Date   LDLCALC 107 (H) 08/16/2019   Lab Results  Component Value Date   TRIG 110 08/16/2019   Lab Results  Component Value Date   CHOLHDL 3.6 08/16/2019   Lab Results  Component Value Date   HGBA1C 5.2 08/16/2019      Assessment & Plan:   Problem List Items Addressed This Visit      Annual visit for general adult medical examination with abnormal findings - Primary   Annual exam as documented. Counseling done  re healthy lifestyle involving commitment to 150 minutes exercise per week, heart healthy diet, and attaining healthy weight.The importance of adequate sleep also discussed. Changes in health habits are decided on by the patient with goals and time frames  set for achieving them. Immunization and cancer screening needs are specifically addressed at this visit.     Relevant Orders  CBC with Differential/Platelet  CMP14+EGFR  Lipid panel  TSH  Vitamin D (25 hydroxy)    Cardiovascular and Mediastinum   Essential hypertension    BP Readings from Last 1 Encounters:  08/14/20 131/79   Well-controlled with Amlodipine-Benazepril Counseled for compliance with the medications Advised DASH diet and moderate exercise/walking, at least 150 mins/week      Relevant Orders   Lipid panel     Other      Encounter for screening for malignant neoplasm of prostate   Relevant Orders   PSA    Other Visit Diagnoses    Encounter for screening for HIV        Relevant Orders   HIV antibody (with reflex)      No orders of the defined types were placed in this encounter.   Follow-up: Return in about 5 months (around 01/14/2021) for HTN and second dose of Shingrix.    Lindell Spar, MD

## 2020-08-14 NOTE — Assessment & Plan Note (Signed)
BP Readings from Last 1 Encounters:  08/14/20 131/79   Well-controlled with Amlodipine-Benazepril Counseled for compliance with the medications Advised DASH diet and moderate exercise/walking, at least 150 mins/week

## 2020-08-15 LAB — CMP14+EGFR
ALT: 17 IU/L (ref 0–44)
AST: 18 IU/L (ref 0–40)
Albumin/Globulin Ratio: 2.4 — ABNORMAL HIGH (ref 1.2–2.2)
Albumin: 4.8 g/dL (ref 3.8–4.9)
Alkaline Phosphatase: 42 IU/L — ABNORMAL LOW (ref 44–121)
BUN/Creatinine Ratio: 29 — ABNORMAL HIGH (ref 9–20)
BUN: 21 mg/dL (ref 6–24)
Bilirubin Total: 1.1 mg/dL (ref 0.0–1.2)
CO2: 21 mmol/L (ref 20–29)
Calcium: 9 mg/dL (ref 8.7–10.2)
Chloride: 107 mmol/L — ABNORMAL HIGH (ref 96–106)
Creatinine, Ser: 0.73 mg/dL — ABNORMAL LOW (ref 0.76–1.27)
Globulin, Total: 2 g/dL (ref 1.5–4.5)
Glucose: 97 mg/dL (ref 65–99)
Potassium: 4 mmol/L (ref 3.5–5.2)
Sodium: 141 mmol/L (ref 134–144)
Total Protein: 6.8 g/dL (ref 6.0–8.5)
eGFR: 105 mL/min/{1.73_m2} (ref >59)

## 2020-08-15 LAB — CBC WITH DIFFERENTIAL/PLATELET
Basophils Absolute: 0 10*3/uL (ref 0.0–0.2)
Basos: 1 %
EOS (ABSOLUTE): 0.2 10*3/uL (ref 0.0–0.4)
Eos: 4 %
Hematocrit: 43.4 % (ref 37.5–51.0)
Hemoglobin: 15.2 g/dL (ref 13.0–17.7)
Immature Grans (Abs): 0 10*3/uL (ref 0.0–0.1)
Immature Granulocytes: 0 %
Lymphocytes Absolute: 2.3 10*3/uL (ref 0.7–3.1)
Lymphs: 40 %
MCH: 31.1 pg (ref 26.6–33.0)
MCHC: 35 g/dL (ref 31.5–35.7)
MCV: 89 fL (ref 79–97)
Monocytes Absolute: 0.5 10*3/uL (ref 0.1–0.9)
Monocytes: 8 %
Neutrophils Absolute: 2.7 10*3/uL (ref 1.4–7.0)
Neutrophils: 47 %
Platelets: 198 10*3/uL (ref 150–450)
RBC: 4.88 x10E6/uL (ref 4.14–5.80)
RDW: 12.3 % (ref 11.6–15.4)
WBC: 5.8 10*3/uL (ref 3.4–10.8)

## 2020-08-15 LAB — VITAMIN D 25 HYDROXY (VIT D DEFICIENCY, FRACTURES): Vit D, 25-Hydroxy: 23.3 ng/mL — ABNORMAL LOW (ref 30.0–100.0)

## 2020-08-15 LAB — LIPID PANEL
Chol/HDL Ratio: 3.5 ratio (ref 0.0–5.0)
Cholesterol, Total: 156 mg/dL (ref 100–199)
HDL: 45 mg/dL (ref >39)
LDL Chol Calc (NIH): 89 mg/dL (ref 0–99)
Triglycerides: 121 mg/dL (ref 0–149)
VLDL Cholesterol Cal: 22 mg/dL (ref 5–40)

## 2020-08-15 LAB — PSA: Prostate Specific Ag, Serum: 1.3 ng/mL (ref 0.0–4.0)

## 2020-08-15 LAB — TSH: TSH: 3.06 u[IU]/mL (ref 0.450–4.500)

## 2020-08-15 LAB — HIV ANTIBODY (ROUTINE TESTING W REFLEX): HIV Screen 4th Generation wRfx: NONREACTIVE

## 2020-08-18 ENCOUNTER — Other Ambulatory Visit: Payer: Self-pay | Admitting: *Deleted

## 2020-08-18 DIAGNOSIS — R109 Unspecified abdominal pain: Secondary | ICD-10-CM

## 2020-08-18 MED ORDER — TAMSULOSIN HCL 0.4 MG PO CAPS: 0.4000 mg | ORAL_CAPSULE | Freq: Every day | ORAL | 3 refills | Status: DC

## 2020-09-03 ENCOUNTER — Other Ambulatory Visit: Payer: Self-pay | Admitting: Family Medicine

## 2020-09-03 DIAGNOSIS — I10 Essential (primary) hypertension: Secondary | ICD-10-CM

## 2020-11-13 ENCOUNTER — Other Ambulatory Visit: Payer: Self-pay

## 2020-11-13 ENCOUNTER — Encounter: Payer: Self-pay | Admitting: Nurse Practitioner

## 2020-11-13 ENCOUNTER — Ambulatory Visit (HOSPITAL_COMMUNITY)
Admission: RE | Admit: 2020-11-13 | Discharge: 2020-11-13 | Disposition: A | Discharge status: Home / Self Care | Payer: 59 | Source: Ambulatory Visit | Attending: Nurse Practitioner | Admitting: Nurse Practitioner

## 2020-11-13 ENCOUNTER — Ambulatory Visit (INDEPENDENT_AMBULATORY_CARE_PROVIDER_SITE_OTHER): Payer: 59

## 2020-11-13 ENCOUNTER — Ambulatory Visit (INDEPENDENT_AMBULATORY_CARE_PROVIDER_SITE_OTHER): Payer: 59 | Admitting: Nurse Practitioner

## 2020-11-13 VITALS — BP 140/84 | HR 64 | Temp 97.9°F | Ht 64.0 in | Wt 191.0 lb

## 2020-11-13 DIAGNOSIS — Z23 Encounter for immunization: Secondary | ICD-10-CM

## 2020-11-13 DIAGNOSIS — G8929 Other chronic pain: Secondary | ICD-10-CM | POA: Diagnosis not present

## 2020-11-13 DIAGNOSIS — K219 Gastro-esophageal reflux disease without esophagitis: Secondary | ICD-10-CM

## 2020-11-13 DIAGNOSIS — M545 Low back pain, unspecified: Secondary | ICD-10-CM

## 2020-11-13 DIAGNOSIS — R059 Cough, unspecified: Secondary | ICD-10-CM | POA: Diagnosis not present

## 2020-11-13 MED ORDER — OMEPRAZOLE 40 MG PO CPDR: 40.0000 mg | DELAYED_RELEASE_CAPSULE | Freq: Every day | ORAL | 1 refills | Status: DC

## 2020-11-13 NOTE — Patient Instructions (Signed)
I sent in omeprazole for your cough. I think it is coming from acid reflux. After taking the 40 mg prescription dose for 60 days, try over-the-counter Prilosec (20 mg) daily. If you have no cough or heartburn with the lower dose, you can continue that. If the cough comes back after trying the lower dose, let us know and we can send in the prescription strength again.

## 2020-11-13 NOTE — Assessment & Plan Note (Signed)
-  was sick 3 weeks ago, but has persistent cough at night -treating for GERD since it is worse when lying down and lungs sounded great on exam -will get CXR to r/o PNA/tumor

## 2020-11-13 NOTE — Assessment & Plan Note (Signed)
-  cough is worse at night when he lies down -will treat for GERD -Rx. omeprazole -After taking this for 60 days, try over-the-counter Prilosec (20 mg) daily

## 2020-11-13 NOTE — Progress Notes (Signed)
Acute Office Visit  Subjective:    Patient ID: Nicholas Howard, male    DOB: 1961-02-02, 60 y.o.   MRN: [id number]  Chief Complaint  Patient presents with   Cough    Ongoing x3 weeks, only at night when he's laying flat, cough is productive.   Back Pain    L lower back pain, ongoing x6 months. Has had an ultrasound and that was normal.    Cough  Back Pain  Patient is in today for cough x 3 weeks, productive.  Worse at night when lying down. He tried nyquil and mucinex, but those didn't help.  He was sick about 3 weeks ago, and his cough hasn't gone away.  His back pain has been ongoing for 6 months. He had u/s to r/o kidney stones. He was prescribed flomax, but that didn't help. He rates his pain at 2, but gets as high as 4.  The pain is throughout his left lower back, but he denies radiation into his legs. He states he tried a muscle relaxer beofre,a nd it droppe dhis blood pressure.   Past Medical History:  Diagnosis Date   Encounter for screening for malignant neoplasm of prostate 03/19/2019   Hypertension    Trapezius muscle spasm 03/19/2019    Past Surgical History:  Procedure Laterality Date   BACK SURGERY     COLONOSCOPY WITH PROPOFOL N/A 11/13/2019   Procedure: COLONOSCOPY WITH PROPOFOL;  Surgeon: Harvel Quale, MD;  Location: [facility];  Service: Gastroenterology;  Laterality: N/A;  10:00   KNEE SURGERY     POLYPECTOMY  11/13/2019   Procedure: POLYPECTOMY;  Surgeon: Montez Morita, Quillian Quince, MD;  Location: [facility];  Service: Gastroenterology;;    Family History  Problem Relation Age of Onset   Hypertension Mother     Social History   Socioeconomic History   Marital status: Married    Spouse name: Not on file   Number of children: 3   Years of education: Not on file   Highest education level: 4th grade  Occupational History    Comment: [employer]   Tobacco Use   Smoking status: Former   Smokeless tobacco: Never  Substance and Sexual  Activity   Alcohol use: Not Currently   Drug use: Never   Sexual activity: Not Currently  Other Topics Concern   Not on file  Social History Narrative   Lives with wife and youngest daughter   2 dogs      Enjoys outdoors walking when weather is nice, tv       Diet: tries to avoid pork , overall eats all food groups: limited veggies and fruits     Caffeine: 1 cup of coffee daily, sugar free soda-usually caffeine free   Water: 1 bottle daily       Wears seat belt   Smoke detectors    Does not use phone while driving   Social Determinants of Radio broadcast assistant Strain: Not on file  Food Insecurity: Not on file  Transportation Needs: Not on file  Physical Activity: Not on file  Stress: Not on file  Social Connections: Not on file  Intimate Partner Violence: Not on file    Outpatient Medications Prior to Visit  Medication Sig Dispense Refill   acetaminophen (TYLENOL) 500 MG tablet Take 1,000 mg by mouth every 6 (six) hours as needed for moderate pain or headache.     amLODipine-benazepril (LOTREL) 10-40 MG capsule Take 1 capsule by  mouth once daily 90 capsule 0   ibuprofen (ADVIL) 800 MG tablet Take 1 tablet (800 mg total) by mouth every 8 (eight) hours as needed. 30 tablet 0   tamsulosin (FLOMAX) 0.4 MG CAPS capsule Take 1 capsule (0.4 mg total) by mouth daily. 30 capsule 3   No facility-administered medications prior to visit.    No Known Allergies  Review of Systems  Constitutional: Negative.   Respiratory:  Positive for cough.   Cardiovascular: Negative.   Genitourinary: Negative.   Musculoskeletal:  Positive for back pain.      Objective:    Physical Exam Constitutional:      Appearance: Normal appearance.  Cardiovascular:     Rate and Rhythm: Normal rate and regular rhythm.     Pulses: Normal pulses.     Heart sounds: Normal heart sounds.  Pulmonary:     Effort: Pulmonary effort is normal.     Breath sounds: Normal breath sounds.   Musculoskeletal:     Comments: Pain with back flexion and lateral flexion  Neurological:     Mental Status: He is alert.    BP 140/84 (BP Location: Left Arm, Patient Position: Sitting, Cuff Size: Large)   Pulse 64   Temp 97.9 F (36.6 C) (Oral)   Ht '5\' 4"'  (1.626 m)   Wt 191 lb (86.6 kg)   SpO2 97%   BMI 32.79 kg/m  Wt Readings from Last 3 Encounters:  11/13/20 191 lb (86.6 kg)  08/14/20 188 lb 1.9 oz (85.3 kg)  05/07/20 193 lb (87.5 kg)    Health Maintenance Due  Topic Date Due   Hepatitis C Screening  Never done    There are no preventive care reminders to display for this patient.   Lab Results  Component Value Date   TSH 3.060 08/14/2020   Lab Results  Component Value Date   WBC 5.8 08/14/2020   HGB 15.2 08/14/2020   HCT 43.4 08/14/2020   MCV 89 08/14/2020   PLT 198 08/14/2020   Lab Results  Component Value Date   NA 141 08/14/2020   K 4.0 08/14/2020   CO2 21 08/14/2020   GLUCOSE 97 08/14/2020   BUN 21 08/14/2020   CREATININE 0.73 (L) 08/14/2020   BILITOT 1.1 08/14/2020   ALKPHOS 42 (L) 08/14/2020   AST 18 08/14/2020   ALT 17 08/14/2020   PROT 6.8 08/14/2020   ALBUMIN 4.8 08/14/2020   CALCIUM 9.0 08/14/2020   EGFR 105 08/14/2020   Lab Results  Component Value Date   CHOL 156 08/14/2020   Lab Results  Component Value Date   HDL 45 08/14/2020   Lab Results  Component Value Date   LDLCALC 89 08/14/2020   Lab Results  Component Value Date   TRIG 121 08/14/2020   Lab Results  Component Value Date   CHOLHDL 3.5 08/14/2020   Lab Results  Component Value Date   HGBA1C 5.2 08/16/2019       Assessment & Plan:   Problem List Items Addressed This Visit       Digestive   GERD (gastroesophageal reflux disease)    -cough is worse at night when he lies down -will treat for GERD -Rx. omeprazole -After taking this for 60 days, try over-the-counter Prilosec (20 mg) daily       Relevant Medications   omeprazole (PRILOSEC) 40 MG  capsule     Other   Cough    -was sick 3 weeks ago, but has persistent cough at  night -treating for GERD since it is worse when lying down and lungs sounded great on exam -will get CXR to r/o PNA/tumor      Relevant Orders   DG Chest 2 View   Chronic left-sided low back pain without sciatica - Primary    -ongoing x6 months despite treatment -w/u for kidney stone was negative -referral to ortho      Relevant Orders   Ambulatory referral to Orthopedic Surgery     Meds ordered this encounter  Medications   omeprazole (PRILOSEC) 40 MG capsule    Sig: Take 1 capsule (40 mg total) by mouth daily.    Dispense:  30 capsule    Refill:  [place], NP

## 2020-11-13 NOTE — Assessment & Plan Note (Signed)
-  ongoing x6 months despite treatment -w/u for kidney stone was negative -referral to ortho

## 2020-11-17 NOTE — Progress Notes (Signed)
CXR was negative.

## 2020-12-09 ENCOUNTER — Other Ambulatory Visit: Payer: Self-pay | Admitting: Nurse Practitioner

## 2020-12-09 DIAGNOSIS — I10 Essential (primary) hypertension: Secondary | ICD-10-CM

## 2021-01-15 ENCOUNTER — Ambulatory Visit: Payer: 59 | Admitting: Family Medicine

## 2021-01-15 ENCOUNTER — Ambulatory Visit (INDEPENDENT_AMBULATORY_CARE_PROVIDER_SITE_OTHER): Payer: 59 | Admitting: Internal Medicine

## 2021-01-15 ENCOUNTER — Encounter: Payer: Self-pay | Admitting: Internal Medicine

## 2021-01-15 ENCOUNTER — Other Ambulatory Visit: Payer: Self-pay

## 2021-01-15 VITALS — BP 138/88 | HR 79 | Ht 64.0 in | Wt 190.0 lb

## 2021-01-15 DIAGNOSIS — I10 Essential (primary) hypertension: Secondary | ICD-10-CM

## 2021-01-15 DIAGNOSIS — N401 Enlarged prostate with lower urinary tract symptoms: Secondary | ICD-10-CM | POA: Diagnosis not present

## 2021-01-15 DIAGNOSIS — E559 Vitamin D deficiency, unspecified: Secondary | ICD-10-CM | POA: Insufficient documentation

## 2021-01-15 DIAGNOSIS — R3912 Poor urinary stream: Secondary | ICD-10-CM

## 2021-01-15 DIAGNOSIS — Z23 Encounter for immunization: Secondary | ICD-10-CM

## 2021-01-15 MED ORDER — TAMSULOSIN HCL 0.4 MG PO CAPS: 0.4000 mg | ORAL_CAPSULE | Freq: Every day | ORAL | 1 refills | Status: DC

## 2021-01-15 NOTE — Assessment & Plan Note (Signed)
BP Readings from Last 1 Encounters:  01/15/21 138/88   Well-controlled with Amlodipine-Benazepril Counseled for compliance with the medications Advised DASH diet and moderate exercise/walking, at least 150 mins/week

## 2021-01-15 NOTE — Progress Notes (Signed)
Established Patient Office Visit  Subjective:  Patient ID: Nicholas Howard, male    DOB: December 29, 1960  Age: 60 y.o. MRN: [id number]  CC:  Chief Complaint  Patient presents with   Follow-up    HPI Nicholas Howard is a 60 y.o. male with past medical history of HTN and BPH who presents for follow up of his chronic medical conditions.  HTN: BP is well-controlled. Takes medications regularly. Patient denies headache, dizziness, chest pain, dyspnea or palpitations.  He has been having weak urine stream, but has not been taking Flomax. Denies any dysuria, hematuria or flank pain. Wakes up 1 or 2 times for urination at nighttime.  He received flu vaccine in the office today.  Past Medical History:  Diagnosis Date   Encounter for screening for malignant neoplasm of prostate 03/19/2019   Hypertension    Trapezius muscle spasm 03/19/2019    Past Surgical History:  Procedure Laterality Date   BACK SURGERY     COLONOSCOPY WITH PROPOFOL N/A 11/13/2019   Procedure: COLONOSCOPY WITH PROPOFOL;  Surgeon: Harvel Quale, MD;  Location: [facility];  Service: Gastroenterology;  Laterality: N/A;  10:00   KNEE SURGERY     POLYPECTOMY  11/13/2019   Procedure: POLYPECTOMY;  Surgeon: Montez Morita, Quillian Quince, MD;  Location: [facility];  Service: Gastroenterology;;    Family History  Problem Relation Age of Onset   Hypertension Mother     Social History   Socioeconomic History   Marital status: Married    Spouse name: Not on file   Number of children: 3   Years of education: Not on file   Highest education level: 4th grade  Occupational History    Comment: [employer]   Tobacco Use   Smoking status: Former   Smokeless tobacco: Never  Substance and Sexual Activity   Alcohol use: Not Currently   Drug use: Never   Sexual activity: Not Currently  Other Topics Concern   Not on file  Social History Narrative   Lives with wife and youngest daughter   2 dogs      Enjoys  outdoors walking when weather is nice, tv       Diet: tries to avoid pork , overall eats all food groups: limited veggies and fruits     Caffeine: 1 cup of coffee daily, sugar free soda-usually caffeine free   Water: 1 bottle daily       Wears seat belt   Smoke detectors    Does not use phone while driving   Social Determinants of Radio broadcast assistant Strain: Not on file  Food Insecurity: Not on file  Transportation Needs: Not on file  Physical Activity: Not on file  Stress: Not on file  Social Connections: Not on file  Intimate Partner Violence: Not on file    Outpatient Medications Prior to Visit  Medication Sig Dispense Refill   acetaminophen (TYLENOL) 500 MG tablet Take 1,000 mg by mouth every 6 (six) hours as needed for moderate pain or headache.     amLODipine-benazepril (LOTREL) 10-40 MG capsule Take 1 capsule by mouth once daily 90 capsule 0   ibuprofen (ADVIL) 800 MG tablet Take 1 tablet (800 mg total) by mouth every 8 (eight) hours as needed. 30 tablet 0   omeprazole (PRILOSEC) 40 MG capsule Take 1 capsule (40 mg total) by mouth daily. 30 capsule 1   tamsulosin (FLOMAX) 0.4 MG CAPS capsule Take 1 capsule (0.4 mg total) by mouth daily. [place]  capsule 3   No facility-administered medications prior to visit.    No Known Allergies  ROS Review of Systems  Constitutional:  Negative for chills and fever.  HENT:  Negative for congestion and sore throat.   Eyes:  Negative for pain and discharge.  Respiratory:  Negative for cough and shortness of breath.   Cardiovascular:  Negative for chest pain and palpitations.  Gastrointestinal:  Negative for constipation, diarrhea, nausea and vomiting.  Endocrine: Negative for polydipsia and polyuria.  Genitourinary:  Positive for difficulty urinating. Negative for dysuria and hematuria.  Musculoskeletal:  Negative for neck pain and neck stiffness.  Skin:  Negative for rash.  Neurological:  Negative for dizziness, weakness,  numbness and headaches.  Psychiatric/Behavioral:  Negative for agitation and behavioral problems.      Objective:    Physical Exam Vitals reviewed.  Constitutional:      General: He is not in acute distress.    Appearance: He is obese. He is not diaphoretic.  HENT:     Head: Normocephalic and atraumatic.     Nose: Nose normal.     Mouth/Throat:     Mouth: Mucous membranes are moist.  Eyes:     General: No scleral icterus.    Extraocular Movements: Extraocular movements intact.  Cardiovascular:     Rate and Rhythm: Normal rate and regular rhythm.     Pulses: Normal pulses.     Heart sounds: Normal heart sounds. No murmur heard. Pulmonary:     Breath sounds: Normal breath sounds. No wheezing or rales.  Abdominal:     Palpations: Abdomen is soft.     Tenderness: There is no abdominal tenderness.  Musculoskeletal:     Cervical back: Neck supple. No tenderness.     Right lower leg: No edema.     Left lower leg: No edema.  Skin:    General: Skin is warm.     Findings: No rash.  Neurological:     General: No focal deficit present.     Mental Status: He is alert and oriented to person, place, and time.     Sensory: No sensory deficit.     Motor: No weakness.  Psychiatric:        Mood and Affect: Mood normal.        Behavior: Behavior normal.    BP 138/88 (BP Location: Right Arm, Cuff Size: Normal)   Pulse 79   Ht '5\' 4"'  (1.626 m)   Wt 190 lb (86.2 kg)   SpO2 97%   BMI 32.61 kg/m  Wt Readings from Last 3 Encounters:  01/15/21 190 lb (86.2 kg)  11/13/20 191 lb (86.6 kg)  08/14/20 188 lb 1.9 oz (85.3 kg)    Lab Results  Component Value Date   TSH 3.060 08/14/2020   Lab Results  Component Value Date   WBC 5.8 08/14/2020   HGB 15.2 08/14/2020   HCT 43.4 08/14/2020   MCV 89 08/14/2020   PLT 198 08/14/2020   Lab Results  Component Value Date   NA 141 08/14/2020   K 4.0 08/14/2020   CO2 21 08/14/2020   GLUCOSE 97 08/14/2020   BUN 21 08/14/2020   CREATININE  0.73 (L) 08/14/2020   BILITOT 1.1 08/14/2020   ALKPHOS 42 (L) 08/14/2020   AST 18 08/14/2020   ALT 17 08/14/2020   PROT 6.8 08/14/2020   ALBUMIN 4.8 08/14/2020   CALCIUM 9.0 08/14/2020   EGFR 105 08/14/2020   Lab Results  Component Value Date  CHOL 156 08/14/2020   Lab Results  Component Value Date   HDL 45 08/14/2020   Lab Results  Component Value Date   LDLCALC 89 08/14/2020   Lab Results  Component Value Date   TRIG 121 08/14/2020   Lab Results  Component Value Date   CHOLHDL 3.5 08/14/2020   Lab Results  Component Value Date   HGBA1C 5.2 08/16/2019      Assessment & Plan:   Problem List Items Addressed This Visit       Cardiovascular and Mediastinum   Essential hypertension - Primary    BP Readings from Last 1 Encounters:  01/15/21 138/88  Well-controlled with Amlodipine-Benazepril Counseled for compliance with the medications Advised DASH diet and moderate exercise/walking, at least 150 mins/week        Genitourinary   Benign prostatic hyperplasia with weak urinary stream    Refilled Flomax, needs to take it regularly Lab Results  Component Value Date   PSA1 1.3 08/14/2020   PSA 1.0 08/16/2019   PSA 0.9 03/19/2019         Relevant Medications   tamsulosin (FLOMAX) 0.4 MG CAPS capsule     Other   Need for immunization against influenza   Relevant Orders   Flu Vaccine QUAD 34moIM (Fluarix, Fluzone & Alfiuria Quad PF) (Completed)   Vitamin D deficiency    Meds ordered this encounter  Medications   tamsulosin (FLOMAX) 0.4 MG CAPS capsule    Sig: Take 1 capsule (0.4 mg total) by mouth daily.    Dispense:  90 capsule    Refill:  1    Follow-up: Return in about 4 months (around 05/15/2021) for HTN.    RLindell Spar MD

## 2021-01-15 NOTE — Patient Instructions (Signed)
Please start taking Tamsulosin for urinary problem.  Okay to take Prilosec for acid reflux/excessive cough after eating.  Please continue to take Amlodipine-Benazepril for BP.  Please follow DASH diet and perform moderate exercise/walking at least 150 mins/week.

## 2021-01-15 NOTE — Assessment & Plan Note (Signed)
.   Last vitamin D Lab Results  Component Value Date   VD25OH 23.3 (L) 08/14/2020   Continue vitamin D supplement

## 2021-01-15 NOTE — Addendum Note (Signed)
Addended by: Zacarias Pontes R on: 01/15/2021 09:27 AM   Modules accepted: Orders

## 2021-01-15 NOTE — Assessment & Plan Note (Addendum)
Refilled Flomax, needs to take it regularly. PSA wnl

## 2021-03-03 ENCOUNTER — Other Ambulatory Visit: Payer: Self-pay

## 2021-03-03 ENCOUNTER — Ambulatory Visit (INDEPENDENT_AMBULATORY_CARE_PROVIDER_SITE_OTHER): Payer: 59 | Admitting: Internal Medicine

## 2021-03-03 ENCOUNTER — Encounter: Payer: Self-pay | Admitting: Internal Medicine

## 2021-03-03 DIAGNOSIS — J069 Acute upper respiratory infection, unspecified: Secondary | ICD-10-CM | POA: Diagnosis not present

## 2021-03-03 MED ORDER — AZITHROMYCIN 250 MG PO TABS: ORAL_TABLET | ORAL | 0 refills | Status: AC

## 2021-03-03 MED ORDER — BENZONATATE 100 MG PO CAPS: 100.0000 mg | ORAL_CAPSULE | Freq: Two times a day (BID) | ORAL | 0 refills | Status: DC | PRN

## 2021-03-03 NOTE — Progress Notes (Signed)
Virtual Visit via Telephone Note   This visit type was conducted due to national recommendations for restrictions regarding the COVID-19 Pandemic (e.g. social distancing) in an effort to limit this patient's exposure and mitigate transmission in our community.  Due to his co-morbid illnesses, this patient is at least at moderate risk for complications without adequate follow up.  This format is felt to be most appropriate for this patient at this time.  The patient did not have access to video technology/had technical difficulties with video requiring transitioning to audio format only (telephone).  All issues noted in this document were discussed and addressed.  No physical exam could be performed with this format.  Evaluation Performed:  Follow-up visit  Date:  03/03/2021   ID:  Nicholas Howard, DOB 1960/12/25, MRN [id number]  Patient Location: Home Provider Location: Office/Clinic  Participants: Patient and Daughter - Maximino Sarin Location of Patient: Home Location of Provider: Telehealth Consent was obtain for visit to be over via telehealth. I verified that I am speaking with the correct person using two identifiers.  PCP:  Lindell Spar, MD   Chief Complaint: Cough  History of Present Illness:    Nicholas Howard is a 60 y.o. male who has a televisit for c/o cough and mild headache for last 2 weeks. He has been taking Mucinex and Robitussin with mild relief.  He denies any fever, chills, dyspnea or wheezing currently.  No smoking history. Has had COVID and flu vaccines.  The patient does not have symptoms concerning for COVID-19 infection (fever, chills, cough, or new shortness of breath).   Past Medical, Surgical, Social History, Allergies, and Medications have been Reviewed.  Past Medical History:  Diagnosis Date   Encounter for screening for malignant neoplasm of prostate 03/19/2019   Hypertension    Trapezius muscle spasm 03/19/2019   Past Surgical History:  Procedure  Laterality Date   BACK SURGERY     COLONOSCOPY WITH PROPOFOL N/A 11/13/2019   Procedure: COLONOSCOPY WITH PROPOFOL;  Surgeon: Harvel Quale, MD;  [facility]: [facility];  Service: Gastroenterology;  Laterality: N/A;  10:00   KNEE SURGERY     POLYPECTOMY  11/13/2019   Procedure: POLYPECTOMY;  Surgeon: Montez Morita, Quillian Quince, MD;  Location: [facility];  Service: Gastroenterology;;     Current Meds  Medication Sig   acetaminophen (TYLENOL) 500 MG tablet Take 1,000 mg by mouth every 6 (six) hours as needed for moderate pain or headache.   amLODipine-benazepril (LOTREL) 10-40 MG capsule Take 1 capsule by mouth once daily   tamsulosin (FLOMAX) 0.4 MG CAPS capsule Take 1 capsule (0.4 mg total) by mouth daily.     Allergies:   Patient has no known allergies.   ROS:   Please see the history of present illness.     All other systems reviewed and are negative.   Labs/Other Tests and Data Reviewed:    Recent Labs: 08/14/2020: ALT 17; BUN 21; Creatinine, Ser 0.73; Hemoglobin 15.2; Platelets 198; Potassium 4.0; Sodium 141; TSH 3.060   Recent Lipid Panel Lab Results  Component Value Date/Time   CHOL 156 08/14/2020 08:58 AM   TRIG 121 08/14/2020 08:58 AM   HDL 45 08/14/2020 08:58 AM   CHOLHDL 3.5 08/14/2020 08:58 AM   CHOLHDL 3.6 08/16/2019 11:38 AM   LDLCALC 89 08/14/2020 08:58 AM   LDLCALC 107 (H) 08/16/2019 11:38 AM    Wt Readings from Last 3 Encounters:  01/15/21 190 lb (86.2 kg)  11/13/20 191 lb (  86.6 kg)  08/14/20 188 lb 1.9 oz (85.3 kg)     ASSESSMENT & PLAN:    URTI Has persistent symptoms despite symptomatic treatment Started Azithromycin Tessalon PRN for cough If persistent symptoms, will get imaging  Time:   Today, I have spent 9 minutes reviewing the chart, including problem list, medications, and with the patient with telehealth technology discussing the above problems.   Medication Adjustments/Labs and Tests Ordered: Current medicines are  reviewed at length with the patient today.  Concerns regarding medicines are outlined above.   Tests Ordered: No orders of the defined types were placed in this encounter.   Medication Changes: No orders of the defined types were placed in this encounter.    Note: This dictation was prepared with Dragon dictation along with smaller phrase technology. Similar sounding words can be transcribed inadequately or may not be corrected upon review. Any transcriptional errors that result from this process are unintentional.      Disposition:  Follow up  Signed, Lindell Spar, MD  03/03/2021 4:51 PM     [facility]

## 2021-03-18 ENCOUNTER — Other Ambulatory Visit: Payer: Self-pay

## 2021-03-18 ENCOUNTER — Telehealth: Payer: 59 | Admitting: Internal Medicine

## 2021-04-08 ENCOUNTER — Ambulatory Visit (INDEPENDENT_AMBULATORY_CARE_PROVIDER_SITE_OTHER): Payer: 59 | Admitting: Internal Medicine

## 2021-04-08 ENCOUNTER — Other Ambulatory Visit: Payer: Self-pay

## 2021-04-08 ENCOUNTER — Encounter: Payer: Self-pay | Admitting: Internal Medicine

## 2021-04-08 DIAGNOSIS — R052 Subacute cough: Secondary | ICD-10-CM

## 2021-04-08 DIAGNOSIS — K219 Gastro-esophageal reflux disease without esophagitis: Secondary | ICD-10-CM | POA: Diagnosis not present

## 2021-04-08 MED ORDER — OMEPRAZOLE 40 MG PO CPDR: 40.0000 mg | DELAYED_RELEASE_CAPSULE | Freq: Every day | ORAL | 3 refills | Status: DC

## 2021-04-08 MED ORDER — GUAIFENESIN-CODEINE 100-10 MG/5ML PO SYRP: 5.0000 mL | ORAL_SOLUTION | Freq: Three times a day (TID) | ORAL | 0 refills | Status: DC | PRN

## 2021-04-08 NOTE — Assessment & Plan Note (Signed)
URTI about a month ago, could be postinfectious cough Cheratussin syrup PRN Could be from GERD, started Omeprazole 40 mg QD If persistent, will need to stop ACEi

## 2021-04-08 NOTE — Progress Notes (Signed)
Virtual Visit via Telephone Note   This visit type was conducted due to national recommendations for restrictions regarding the COVID-19 Pandemic (e.g. social distancing) in an effort to limit this patient's exposure and mitigate transmission in our community.  Due to his co-morbid illnesses, this patient is at least at moderate risk for complications without adequate follow up.  This format is felt to be most appropriate for this patient at this time.  The patient did not have access to video technology/had technical difficulties with video requiring transitioning to audio format only (telephone).  All issues noted in this document were discussed and addressed.  No physical exam could be performed with this format.  Evaluation Performed:  Follow-up visit  Date:  04/08/2021   ID:  Nicholas Howard, DOB Aug 18, 1960, MRN [id number]  Patient Location: Home Provider Location: Office/Clinic  Participants: Patient and daughter Location of Patient: Home Location of Provider: Telehealth Consent was obtain for visit to be over via telehealth. I verified that I am speaking with the correct person using two identifiers.  PCP:  Lindell Spar, MD   Chief Complaint: Cough  History of Present Illness:    Nicholas Howard is a 61 y.o. male who has a televisit for c/o dry cough, which is worse at nighttime. He feels as if something is in his throat, but is not able to cough out. He denies any fever, chills, nasal congestion, dyspnea or wheezing. He had URTI about 1 month ago, which was treated with Azithromycin and Tessalon. His cough had slightly improved at that time.  The patient does not have symptoms concerning for COVID-19 infection (fever, chills, cough, or new shortness of breath).   Past Medical, Surgical, Social History, Allergies, and Medications have been Reviewed.  Past Medical History:  Diagnosis Date   Encounter for screening for malignant neoplasm of prostate 03/19/2019    Hypertension    Trapezius muscle spasm 03/19/2019   Past Surgical History:  Procedure Laterality Date   BACK SURGERY     COLONOSCOPY WITH PROPOFOL N/A 11/13/2019   Procedure: COLONOSCOPY WITH PROPOFOL;  Surgeon: Harvel Quale, MD;  [facility];  Service: Gastroenterology;  Laterality: N/A;  10:00   KNEE SURGERY     POLYPECTOMY  11/13/2019   Procedure: POLYPECTOMY;  Surgeon: Montez Morita, Quillian Quince, MD;  [facility]: [facility];  Service: Gastroenterology;;     Current Meds  Medication Sig   acetaminophen (TYLENOL) 500 MG tablet Take 1,000 mg by mouth every 6 (six) hours as needed for moderate pain or headache.   amLODipine-benazepril (LOTREL) 10-40 MG capsule Take 1 capsule by mouth once daily   tamsulosin (FLOMAX) 0.4 MG CAPS capsule Take 1 capsule (0.4 mg total) by mouth daily.     Allergies:   Patient has no known allergies.   ROS:   Please see the history of present illness.     All other systems reviewed and are negative.   Labs/Other Tests and Data Reviewed:    Recent Labs: 08/14/2020: ALT 17; BUN 21; Creatinine, Ser 0.73; Hemoglobin 15.2; Platelets 198; Potassium 4.0; Sodium 141; TSH 3.060   Recent Lipid Panel Lab Results  Component Value Date/Time   CHOL 156 08/14/2020 08:58 AM   TRIG 121 08/14/2020 08:58 AM   HDL 45 08/14/2020 08:58 AM   CHOLHDL 3.5 08/14/2020 08:58 AM   CHOLHDL 3.6 08/16/2019 11:38 AM   LDLCALC 89 08/14/2020 08:58 AM   LDLCALC 107 (H) 08/16/2019 11:38 AM    Wt  Readings from Last 3 Encounters:  01/15/21 190 lb (86.2 kg)  11/13/20 191 lb (86.6 kg)  08/14/20 188 lb 1.9 oz (85.3 kg)     ASSESSMENT & PLAN:    Cough URTI about a month ago, could be postinfectious cough Cheratussin syrup PRN Could be from GERD, started Omeprazole 40 mg QD If persistent, will need to stop ACEi   Time:   Today, I have spent 12 minutes reviewing the chart, including problem list, medications, and with the patient with telehealth  technology discussing the above problems.   Medication Adjustments/Labs and Tests Ordered: Current medicines are reviewed at length with the patient today.  Concerns regarding medicines are outlined above.   Tests Ordered: No orders of the defined types were placed in this encounter.   Medication Changes: No orders of the defined types were placed in this encounter.    Note: This dictation was prepared with Dragon dictation along with smaller phrase technology. Similar sounding words can be transcribed inadequately or may not be corrected upon review. Any transcriptional errors that result from this process are unintentional.      Disposition:  Follow up  Signed, Lindell Spar, MD  04/08/2021 4:52 PM     [facility]

## 2021-04-08 NOTE — Patient Instructions (Signed)
Please start taking Omeprazole as prescribed. Please avoid hot and spicy food.  Please take Cheratussin for cough.

## 2021-04-22 ENCOUNTER — Encounter: Payer: Self-pay | Admitting: Internal Medicine

## 2021-04-23 ENCOUNTER — Other Ambulatory Visit: Payer: Self-pay

## 2021-04-23 ENCOUNTER — Ambulatory Visit (HOSPITAL_COMMUNITY)
Admission: RE | Admit: 2021-04-23 | Discharge: 2021-04-23 | Disposition: A | Discharge status: Home / Self Care | Payer: 59 | Source: Ambulatory Visit | Attending: Internal Medicine | Admitting: Internal Medicine

## 2021-04-23 ENCOUNTER — Other Ambulatory Visit: Payer: Self-pay | Admitting: Internal Medicine

## 2021-04-23 DIAGNOSIS — R052 Subacute cough: Secondary | ICD-10-CM | POA: Diagnosis not present

## 2021-04-23 MED ORDER — METHYLPREDNISOLONE 4 MG PO TBPK: ORAL_TABLET | ORAL | 0 refills | Status: DC

## 2021-04-26 ENCOUNTER — Other Ambulatory Visit: Payer: Self-pay | Admitting: *Deleted

## 2021-04-26 ENCOUNTER — Telehealth: Payer: Self-pay | Admitting: Internal Medicine

## 2021-04-26 DIAGNOSIS — I10 Essential (primary) hypertension: Secondary | ICD-10-CM

## 2021-04-26 MED ORDER — AMLODIPINE BESY-BENAZEPRIL HCL 10-40 MG PO CAPS: 1.0000 | ORAL_CAPSULE | Freq: Every day | ORAL | 0 refills | Status: DC

## 2021-04-26 NOTE — Telephone Encounter (Signed)
Medication sent to pharmacy  

## 2021-04-26 NOTE — Telephone Encounter (Signed)
Pt needs refill on   amLODipine-benazepril (LOTREL) 10-40 MG capsule   [facility] [place]

## 2021-05-13 ENCOUNTER — Ambulatory Visit (INDEPENDENT_AMBULATORY_CARE_PROVIDER_SITE_OTHER): Payer: 59 | Admitting: Internal Medicine

## 2021-05-13 ENCOUNTER — Encounter: Payer: Self-pay | Admitting: Internal Medicine

## 2021-05-13 ENCOUNTER — Other Ambulatory Visit: Payer: Self-pay

## 2021-05-13 VITALS — BP 132/84 | HR 61 | Resp 18 | Ht 64.0 in | Wt 192.0 lb

## 2021-05-13 DIAGNOSIS — N401 Enlarged prostate with lower urinary tract symptoms: Secondary | ICD-10-CM

## 2021-05-13 DIAGNOSIS — Z0001 Encounter for general adult medical examination with abnormal findings: Secondary | ICD-10-CM | POA: Diagnosis not present

## 2021-05-13 DIAGNOSIS — K219 Gastro-esophageal reflux disease without esophagitis: Secondary | ICD-10-CM

## 2021-05-13 DIAGNOSIS — I10 Essential (primary) hypertension: Secondary | ICD-10-CM

## 2021-05-13 DIAGNOSIS — E559 Vitamin D deficiency, unspecified: Secondary | ICD-10-CM

## 2021-05-13 DIAGNOSIS — R3912 Poor urinary stream: Secondary | ICD-10-CM

## 2021-05-13 DIAGNOSIS — Z1159 Encounter for screening for other viral diseases: Secondary | ICD-10-CM

## 2021-05-13 DIAGNOSIS — Z0000 Encounter for general adult medical examination without abnormal findings: Secondary | ICD-10-CM

## 2021-05-13 NOTE — Assessment & Plan Note (Addendum)
Well controlled with omeprazole, advised to take it as needed ?Was started as he was having chronic cough as GERD can also cause dry cough, cough has resolved now ?

## 2021-05-13 NOTE — Progress Notes (Signed)
Established Patient Office Visit  Subjective:  Patient ID: Nicholas Howard, male    DOB: 10-11-1960  Age: 61 y.o. MRN: [id number]  CC:  Chief Complaint  Patient presents with   Follow-up    4 month follow up HTN     HPI Nicholas Howard is a 61 y.o. male with past medical history of HTN and BPH who presents for f/u of his chronic medical conditions.  HTN: BP is well-controlled. Takes medications regularly. Patient denies headache, dizziness, chest pain, dyspnea or palpitations.  His cough is resolved now after taking Medrol Dosepak.  Denies any dyspnea or wheezing currently.  He takes Flomax for BPH.  Denies any dysuria, hematuria or urinary hesitance/resistance currently.  He takes omeprazole for GERD.  He agrees to take it on a as needed basis now.  Denies any dysphagia or odynophagia currently.  Past Medical History:  Diagnosis Date   Encounter for screening for malignant neoplasm of prostate 03/19/2019   Hypertension    Trapezius muscle spasm 03/19/2019    Past Surgical History:  Procedure Laterality Date   BACK SURGERY     COLONOSCOPY WITH PROPOFOL N/A 11/13/2019   Procedure: COLONOSCOPY WITH PROPOFOL;  Surgeon: Harvel Quale, MD;  [facility]: [facility];  Service: Gastroenterology;  Laterality: N/A;  10:00   KNEE SURGERY     POLYPECTOMY  11/13/2019   Procedure: POLYPECTOMY;  Surgeon: Montez Morita, Quillian Quince, MD;  [facility]: [facility];  Service: Gastroenterology;;    Family History  Problem Relation Age of Onset   Hypertension Mother     Social History   Socioeconomic History   Marital status: Married    Spouse name: Not on file   Number of children: 3   Years of education: Not on file   Highest education level: 4th grade  Occupational History    Comment: [employer]   Tobacco Use   Smoking status: Former   Smokeless tobacco: Never  Substance and Sexual Activity   Alcohol use: Not Currently   Drug use: Never   Sexual activity: Not Currently   Other Topics Concern   Not on file  Social History Narrative   Lives with wife and youngest daughter   2 dogs      Enjoys outdoors walking when weather is nice, tv       Diet: tries to avoid pork , overall eats all food groups: limited veggies and fruits     Caffeine: 1 cup of coffee daily, sugar free soda-usually caffeine free   Water: 1 bottle daily       Wears seat belt   Smoke detectors    Does not use phone while driving   Social Determinants of Radio broadcast assistant Strain: Not on file  Food Insecurity: Not on file  Transportation Needs: Not on file  Physical Activity: Not on file  Stress: Not on file  Social Connections: Not on file  Intimate Partner Violence: Not on file    Outpatient Medications Prior to Visit  Medication Sig Dispense Refill   acetaminophen (TYLENOL) 500 MG tablet Take 1,000 mg by mouth every 6 (six) hours as needed for moderate pain or headache.     amLODipine-benazepril (LOTREL) 10-40 MG capsule Take 1 capsule by mouth daily. 90 capsule 0   omeprazole (PRILOSEC) 40 MG capsule Take 1 capsule (40 mg total) by mouth daily. 30 capsule 3   tamsulosin (FLOMAX) 0.4 MG CAPS capsule Take 1 capsule (0.4 mg total) by mouth daily. Fyffe  capsule 1   benzonatate (TESSALON) 100 MG capsule Take 1 capsule (100 mg total) by mouth 2 (two) times daily as needed for cough. (Patient not taking: Reported on 04/08/2021) 30 capsule 0   guaiFENesin-codeine (ROBITUSSIN AC) 100-10 MG/5ML syrup Take 5 mLs by mouth 3 (three) times daily as needed for cough. (Patient not taking: Reported on 05/13/2021) 120 mL 0   ibuprofen (ADVIL) 800 MG tablet Take 1 tablet (800 mg total) by mouth every 8 (eight) hours as needed. (Patient not taking: Reported on 03/03/2021) 30 tablet 0   methylPREDNISolone (MEDROL DOSEPAK) 4 MG TBPK tablet Take as package instructions. (Patient not taking: Reported on 05/13/2021) 1 each 0   No facility-administered medications prior to visit.    No Known  Allergies  ROS Review of Systems  Constitutional:  Negative for chills and fever.  HENT:  Negative for congestion and sore throat.   Eyes:  Negative for pain and discharge.  Respiratory:  Negative for cough and shortness of breath.   Cardiovascular:  Negative for chest pain and palpitations.  Gastrointestinal:  Negative for constipation, diarrhea, nausea and vomiting.  Endocrine: Negative for polydipsia and polyuria.  Genitourinary:  Negative for dysuria and hematuria.  Musculoskeletal:  Negative for neck pain and neck stiffness.  Skin:  Negative for rash.  Neurological:  Negative for dizziness, weakness, numbness and headaches.  Psychiatric/Behavioral:  Negative for agitation and behavioral problems.      Objective:    Physical Exam Vitals reviewed.  Constitutional:      General: He is not in acute distress.    Appearance: He is obese. He is not diaphoretic.  HENT:     Head: Normocephalic and atraumatic.     Nose: Nose normal.     Mouth/Throat:     Mouth: Mucous membranes are moist.  Eyes:     General: No scleral icterus.    Extraocular Movements: Extraocular movements intact.  Cardiovascular:     Rate and Rhythm: Normal rate and regular rhythm.     Pulses: Normal pulses.     Heart sounds: Normal heart sounds. No murmur heard. Pulmonary:     Breath sounds: Normal breath sounds. No wheezing or rales.  Musculoskeletal:     Cervical back: Neck supple. No tenderness.     Right lower leg: No edema.     Left lower leg: No edema.  Skin:    General: Skin is warm.     Findings: No rash.  Neurological:     General: No focal deficit present.     Mental Status: He is alert and oriented to person, place, and time.     Sensory: No sensory deficit.     Motor: No weakness.  Psychiatric:        Mood and Affect: Mood normal.        Behavior: Behavior normal.    BP 132/84 (BP Location: Left Arm, Patient Position: Sitting, Cuff Size: Normal)    Pulse 61    Resp 18    Ht '5\' 4"'   (1.626 m)    Wt 192 lb (87.1 kg)    SpO2 97%    BMI 32.96 kg/m  Wt Readings from Last 3 Encounters:  05/13/21 192 lb (87.1 kg)  01/15/21 190 lb (86.2 kg)  11/13/20 191 lb (86.6 kg)    Lab Results  Component Value Date   TSH 3.060 08/14/2020   Lab Results  Component Value Date   WBC 5.8 08/14/2020   HGB 15.2 08/14/2020  HCT 43.4 08/14/2020   MCV 89 08/14/2020   PLT 198 08/14/2020   Lab Results  Component Value Date   NA 141 08/14/2020   K 4.0 08/14/2020   CO2 21 08/14/2020   GLUCOSE 97 08/14/2020   BUN 21 08/14/2020   CREATININE 0.73 (L) 08/14/2020   BILITOT 1.1 08/14/2020   ALKPHOS 42 (L) 08/14/2020   AST 18 08/14/2020   ALT 17 08/14/2020   PROT 6.8 08/14/2020   ALBUMIN 4.8 08/14/2020   CALCIUM 9.0 08/14/2020   EGFR 105 08/14/2020   Lab Results  Component Value Date   CHOL 156 08/14/2020   Lab Results  Component Value Date   HDL 45 08/14/2020   Lab Results  Component Value Date   LDLCALC 89 08/14/2020   Lab Results  Component Value Date   TRIG 121 08/14/2020   Lab Results  Component Value Date   CHOLHDL 3.5 08/14/2020   Lab Results  Component Value Date   HGBA1C 5.2 08/16/2019      Assessment & Plan:   Problem List Items Addressed This Visit       Cardiovascular and Mediastinum   Essential hypertension - Primary    BP Readings from Last 1 Encounters:  05/13/21 132/84  Well-controlled with Amlodipine-Benazepril Counseled for compliance with the medications Advised DASH diet and moderate exercise/walking, at least 150 mins/week        Digestive   GERD (gastroesophageal reflux disease)    Well controlled with omeprazole, advised to take it as needed Was started as he was having chronic cough as GERD can also cause dry cough, cough has resolved now        Genitourinary   Benign prostatic hyperplasia with weak urinary stream    Refilled Flomax, needs to take it regularly      Relevant Orders   PSA     Other   Vitamin D  deficiency    . Last vitamin D Lab Results  Component Value Date   VD25OH 23.3 (L) 08/14/2020  Continue vitamin D supplement      Relevant Orders   VITAMIN D 25 Hydroxy (Vit-D Deficiency, Fractures)   Other Visit Diagnoses     Need for hepatitis C screening test       Relevant Orders   Hepatitis C Antibody       No orders of the defined types were placed in this encounter.   Follow-up: Return in about 15 weeks (around 08/26/2021) for Annual physical.    Lindell Spar, MD

## 2021-05-13 NOTE — Assessment & Plan Note (Signed)
BP Readings from Last 1 Encounters:  ?05/13/21 132/84  ? ?Well-controlled with Amlodipine-Benazepril ?Counseled for compliance with the medications ?Advised DASH diet and moderate exercise/walking, at least 150 mins/week ?

## 2021-05-13 NOTE — Patient Instructions (Addendum)
Please continue to take medications as prescribed. ? ?Please continue to follow low salt diet and perform moderate exercise/walking at least 150 mins/week. ? ?Please get fasting blood tests before the next visit. ?

## 2021-05-13 NOTE — Assessment & Plan Note (Signed)
. ?  Last vitamin D ?Lab Results  ?Component Value Date  ? VD25OH 23.3 (L) 08/14/2020  ? ?Continue vitamin D supplement ?

## 2021-05-13 NOTE — Assessment & Plan Note (Addendum)
Refilled Flomax, needs to take it regularly 

## 2021-07-02 ENCOUNTER — Encounter: Payer: Self-pay | Admitting: Internal Medicine

## 2021-07-02 ENCOUNTER — Ambulatory Visit (INDEPENDENT_AMBULATORY_CARE_PROVIDER_SITE_OTHER): Payer: 59 | Admitting: Internal Medicine

## 2021-07-02 DIAGNOSIS — I10 Essential (primary) hypertension: Secondary | ICD-10-CM | POA: Diagnosis not present

## 2021-07-02 DIAGNOSIS — R053 Chronic cough: Secondary | ICD-10-CM | POA: Diagnosis not present

## 2021-07-02 MED ORDER — AMLODIPINE-OLMESARTAN 10-40 MG PO TABS: 1.0000 | ORAL_TABLET | Freq: Every day | ORAL | 1 refills | Status: DC

## 2021-07-02 NOTE — Patient Instructions (Signed)
Please stop taking amlodipine-benazepril for blood pressure.  Please start  taking amlodipine-olmesartan for blood pressure. ? ?Okay to take Mucinex or Robitussin for cough. ?

## 2021-07-02 NOTE — Assessment & Plan Note (Signed)
BP Readings from Last 1 Encounters:  ?05/13/21 132/84  ? ?Well-controlled with Amlodipine-Benazepril, but  switched to Azor as he is having chronic cough, could be due to ACE inhibitor ?Counseled for compliance with the medications ?Advised DASH diet and moderate exercise/walking, at least 150 mins/week ?

## 2021-07-02 NOTE — Progress Notes (Signed)
?  ? ?Virtual Visit via Telephone Note  ? ?This visit type was conducted due to national recommendations for restrictions regarding the COVID-19 Pandemic (e.g. social distancing) in an effort to limit this patient's exposure and mitigate transmission in our community.  Due to his co-morbid illnesses, this patient is at least at moderate risk for complications without adequate follow up.  This format is felt to be most appropriate for this patient at this time.  The patient did not have access to video technology/had technical difficulties with video requiring transitioning to audio format only (telephone).  All issues noted in this document were discussed and addressed.  No physical exam could be performed with this format. ? ?Evaluation Performed:  Follow-up visit ? ?Date:  07/02/2021  ? ?ID:  Nicholas Howard, DOB 1960/12/19, MRN [id number] ? ?Patient Location: Home ?Provider Location: Office/Clinic ? ?Participants: Patient and daughter Nicholas Howard ?Location of Patient: Home ?Location of Provider: Telehealth ?Consent was obtain for visit to be over via telehealth. ?I verified that I am speaking with the correct person using two identifiers. ? ?PCP:  Lindell Spar, MD  ? ?Chief Complaint: Cough ? ?History of Present Illness:   ? ?Nicholas Howard is a 61 y.o. male who has a televisit for complaint of chronic cough, which has been worse recently.  He has completed course of antibiotics in the past for acute bronchitis.  He was also given a trial of omeprazole for possible GERD related cough, but he has recurrent cough.  Of note, he has been taking ACE inhibitor for HTN, which could be inducing cough.  He currently denies any dyspnea or wheezing. ? ?The patient does not have symptoms concerning for COVID-19 infection (fever, chills, cough, or new shortness of breath).  ? ?Past Medical, Surgical, Social History, Allergies, and Medications have been Reviewed. ? ?Past Medical History:  ?Diagnosis Date  ? Encounter for  screening for malignant neoplasm of prostate 03/19/2019  ? Hypertension   ? Trapezius muscle spasm 03/19/2019  ? ?Past Surgical History:  ?Procedure Laterality Date  ? BACK SURGERY    ? COLONOSCOPY WITH PROPOFOL N/A 11/13/2019  ? Procedure: COLONOSCOPY WITH PROPOFOL;  Surgeon: Harvel Quale, MD;  Location: [facility];  Service: Gastroenterology;  Laterality: N/A;  10:00  ? KNEE SURGERY    ? POLYPECTOMY  11/13/2019  ? Procedure: POLYPECTOMY;  Surgeon: Harvel Quale, MD;  Location: [facility];  Service: Gastroenterology;;  ?  ? ?Current Meds  ?Medication Sig  ? acetaminophen (TYLENOL) 500 MG tablet Take 1,000 mg by mouth every 6 (six) hours as needed for moderate pain or headache.  ? amLODipine-benazepril (LOTREL) 10-40 MG capsule Take 1 capsule by mouth daily.  ? omeprazole (PRILOSEC) 40 MG capsule Take 1 capsule (40 mg total) by mouth daily.  ? tamsulosin (FLOMAX) 0.4 MG CAPS capsule Take 1 capsule (0.4 mg total) by mouth daily.  ?  ? ?Allergies:   Patient has no known allergies.  ? ?ROS:   ?Please see the history of present illness.    ? ?All other systems reviewed and are negative. ? ? ?Labs/Other Tests and Data Reviewed:   ? ?Recent Labs: ?08/14/2020: ALT 17; BUN 21; Creatinine, Ser 0.73; Hemoglobin 15.2; Platelets 198; Potassium 4.0; Sodium 141; TSH 3.060  ? ?Recent Lipid Panel ?Lab Results  ?Component Value Date/Time  ? CHOL 156 08/14/2020 08:58 AM  ? TRIG 121 08/14/2020 08:58 AM  ? HDL 45 08/14/2020 08:58 AM  ? CHOLHDL 3.5 08/14/2020 08:58 AM  ?  CHOLHDL 3.6 08/16/2019 11:38 AM  ? LDLCALC 89 08/14/2020 08:58 AM  ? LDLCALC 107 (H) 08/16/2019 11:38 AM  ? ? ?Wt Readings from Last 3 Encounters:  ?05/13/21 192 lb (87.1 kg)  ?01/15/21 190 lb (86.2 kg)  ?11/13/20 191 lb (86.6 kg)  ?  ? ?ASSESSMENT & PLAN:   ? ?Chronic cough ?Could be ACEi induced, will try to switch to ARB ?Mucinex or Robitussin as needed for cough ?Continue omeprazole for GERD ? ?Essential hypertension ?BP Readings from Last 1  Encounters:  ?05/13/21 132/84  ? ?Well-controlled with Amlodipine-Benazepril, but  switched to Azor as he is having chronic cough, could be due to ACE inhibitor ?Counseled for compliance with the medications ?Advised DASH diet and moderate exercise/walking, at least 150 mins/week ? ? ?Time:   ?Today, I have spent 11 minutes reviewing the chart, including problem list, medications, and with the patient with telehealth technology discussing the above problems. ? ? ?Medication Adjustments/Labs and Tests Ordered: ?Current medicines are reviewed at length with the patient today.  Concerns regarding medicines are outlined above.  ? ?Tests Ordered: ?No orders of the defined types were placed in this encounter. ? ? ?Medication Changes: ?No orders of the defined types were placed in this encounter. ? ? ? ?Note: This dictation was prepared with Dragon dictation along with smaller phrase technology. Similar sounding words can be transcribed inadequately or may not be corrected upon review. Any transcriptional errors that result from this process are unintentional.  ?  ? ? ?Disposition:  Follow up  ?Signed, ?Lindell Spar, MD  ?07/02/2021 12:00 PM    ? ?[facility] ?[facility] ?

## 2021-07-27 ENCOUNTER — Encounter: Payer: Self-pay | Admitting: Internal Medicine

## 2021-07-28 ENCOUNTER — Ambulatory Visit (INDEPENDENT_AMBULATORY_CARE_PROVIDER_SITE_OTHER): Payer: 59 | Admitting: Internal Medicine

## 2021-07-28 ENCOUNTER — Encounter: Payer: Self-pay | Admitting: Internal Medicine

## 2021-07-28 VITALS — BP 128/84 | HR 67 | Resp 18 | Ht 64.0 in | Wt 187.4 lb

## 2021-07-28 DIAGNOSIS — J302 Other seasonal allergic rhinitis: Secondary | ICD-10-CM | POA: Diagnosis not present

## 2021-07-28 DIAGNOSIS — R053 Chronic cough: Secondary | ICD-10-CM

## 2021-07-28 DIAGNOSIS — J4550 Severe persistent asthma, uncomplicated: Secondary | ICD-10-CM | POA: Insufficient documentation

## 2021-07-28 DIAGNOSIS — J45991 Cough variant asthma: Secondary | ICD-10-CM | POA: Diagnosis not present

## 2021-07-28 MED ORDER — BENZONATATE 100 MG PO CAPS: 100.0000 mg | ORAL_CAPSULE | Freq: Two times a day (BID) | ORAL | 0 refills | Status: DC | PRN

## 2021-07-28 MED ORDER — CETIRIZINE HCL 10 MG PO TABS: 10.0000 mg | ORAL_TABLET | Freq: Every day | ORAL | 5 refills | Status: DC

## 2021-07-28 MED ORDER — ALBUTEROL SULFATE HFA 108 (90 BASE) MCG/ACT IN AERS: 2.0000 | INHALATION_SPRAY | Freq: Four times a day (QID) | RESPIRATORY_TRACT | 0 refills | Status: DC | PRN

## 2021-07-28 NOTE — Progress Notes (Signed)
? ?Acute Office Visit ? ?Subjective:  ? ? Patient ID: Nicholas Howard, male    DOB: 10-16-1960, 61 y.o.   MRN: [id number] ? ?Chief Complaint  ?Patient presents with  ? Cough  ?  Pt has had cough since 03/03/21 got better for a few weeks now is back again and will not go away the cough is worse at night   ? ? ?HPI ?Patient is in today for complaint of cough for the last 5 months. Initially, his cough had improved with Medrol Dosepak and Cheratussin, but has had intermittent worsening of cough.  He denies any fever, chills, wheezing or hemoptysis.  He has dyspnea when he has severe bouts of cough.  He has remote history of smoking about 1-2 cigarettes per day, quit around 1990.  He denies any weight loss, fatigue or night sweats.  Of note, his antihypertensive was changed from ACE inhibitor to ARB in the last visit, but has not changed his symptoms.  He also has been taking omeprazole for GERD. ? ?Past Medical History:  ?Diagnosis Date  ? Encounter for screening for malignant neoplasm of prostate 03/19/2019  ? Hypertension   ? Trapezius muscle spasm 03/19/2019  ? ? ?Past Surgical History:  ?Procedure Laterality Date  ? BACK SURGERY    ? COLONOSCOPY WITH PROPOFOL N/A 11/13/2019  ? Procedure: COLONOSCOPY WITH PROPOFOL;  Surgeon: Harvel Quale, MD;  [facility]: [facility];  Service: Gastroenterology;  Laterality: N/A;  10:00  ? KNEE SURGERY    ? POLYPECTOMY  11/13/2019  ? Procedure: POLYPECTOMY;  Surgeon: Harvel Quale, MD;  Location: [facility];  Service: Gastroenterology;;  ? ? ?Family History  ?Problem Relation Age of Onset  ? Hypertension Mother   ? ? ?Social History  ? ?Socioeconomic History  ? Marital status: Married  ?  Spouse name: Not on file  ? Number of children: 3  ? Years of education: Not on file  ? Highest education level: 4th grade  ?Occupational History  ?  Comment: [employer]   ?Tobacco Use  ? Smoking status: Former  ? Smokeless tobacco: Never  ?Substance and Sexual Activity  ?  Alcohol use: Not Currently  ? Drug use: Never  ? Sexual activity: Not Currently  ?Other Topics Concern  ? Not on file  ?Social History Narrative  ? Lives with wife and youngest daughter  ? 2 dogs  ?   ? Enjoys outdoors walking when weather is nice, tv   ?   ? Diet: tries to avoid pork , overall eats all food groups: limited veggies and fruits    ? Caffeine: 1 cup of coffee daily, sugar free soda-usually caffeine free  ? Water: 1 bottle daily   ?   ? Wears seat belt  ? Smoke detectors   ? Does not use phone while driving  ? ?Social Determinants of Health  ? ?Financial Resource Strain: Not on file  ?Food Insecurity: Not on file  ?Transportation Needs: Not on file  ?Physical Activity: Not on file  ?Stress: Not on file  ?Social Connections: Not on file  ?Intimate Partner Violence: Not on file  ? ? ?Outpatient Medications Prior to Visit  ?Medication Sig Dispense Refill  ? acetaminophen (TYLENOL) 500 MG tablet Take 1,000 mg by mouth every 6 (six) hours as needed for moderate pain or headache.    ? amLODipine-olmesartan (AZOR) 10-40 MG tablet Take 1 tablet by mouth daily. 90 tablet 1  ? omeprazole (PRILOSEC) 40 MG capsule Take  1 capsule (40 mg total) by mouth daily. 30 capsule 3  ? tamsulosin (FLOMAX) 0.4 MG CAPS capsule Take 1 capsule (0.4 mg total) by mouth daily. 90 capsule 1  ? ?No facility-administered medications prior to visit.  ? ? ?No Known Allergies ? ?Review of Systems  ?Constitutional:  Negative for chills and fever.  ?HENT:  Negative for congestion and sore throat.   ?Eyes:  Negative for pain and discharge.  ?Respiratory:  Positive for cough and shortness of breath.   ?Cardiovascular:  Negative for chest pain and palpitations.  ?Gastrointestinal:  Negative for constipation, diarrhea, nausea and vomiting.  ?Endocrine: Negative for polydipsia and polyuria.  ?Genitourinary:  Negative for dysuria and hematuria.  ?Musculoskeletal:  Negative for neck pain and neck stiffness.  ?Skin:  Negative for rash.   ?Neurological:  Negative for dizziness, weakness, numbness and headaches.  ?Psychiatric/Behavioral:  Negative for agitation and behavioral problems.   ? ?   ?Objective:  ?  ?Physical Exam ?Vitals reviewed.  ?Constitutional:   ?   General: He is not in acute distress. ?   Appearance: He is obese. He is not diaphoretic.  ?HENT:  ?   Head: Normocephalic and atraumatic.  ?   Nose: Nose normal.  ?   Mouth/Throat:  ?   Mouth: Mucous membranes are moist.  ?Eyes:  ?   General: No scleral icterus. ?   Extraocular Movements: Extraocular movements intact.  ?Cardiovascular:  ?   Rate and Rhythm: Normal rate and regular rhythm.  ?   Pulses: Normal pulses.  ?   Heart sounds: Normal heart sounds. No murmur heard. ?Pulmonary:  ?   Breath sounds: Normal breath sounds. No wheezing or rales.  ?Musculoskeletal:  ?   Cervical back: Neck supple. No tenderness.  ?   Right lower leg: No edema.  ?   Left lower leg: No edema.  ?Skin: ?   General: Skin is warm.  ?   Findings: No rash.  ?Neurological:  ?   General: No focal deficit present.  ?   Mental Status: He is alert and oriented to person, place, and time.  ?   Sensory: No sensory deficit.  ?   Motor: No weakness.  ?Psychiatric:     ?   Mood and Affect: Mood normal.     ?   Behavior: Behavior normal.  ? ? ?BP 128/84 (BP Location: Left Arm, Patient Position: Sitting, Cuff Size: Normal)   Pulse 67   Resp 18   Ht '5\' 4"'$  (1.626 m)   Wt 187 lb 6.4 oz (85 kg)   SpO2 93%   BMI 32.17 kg/m?  ?Wt Readings from Last 3 Encounters:  ?07/28/21 187 lb 6.4 oz (85 kg)  ?05/13/21 192 lb (87.1 kg)  ?01/15/21 190 lb (86.2 kg)  ? ? ? ?   ?Assessment & Plan:  ? ?Problem List Items Addressed This Visit   ?None ?Visit Diagnoses   ? ? Chronic cough    -  Primary  ? Relevant Medications  ? benzonatate (TESSALON) 100 MG capsule  ? albuterol (VENTOLIN HFA) 108 (90 Base) MCG/ACT inhaler  ? Other Relevant Orders  ? CT Chest Wo Contrast  ? Simple chronic bronchitis (HCC)      ? Relevant Orders  ? CT Chest Wo  Contrast  ? ?  ? ? ? ?Meds ordered this encounter  ?Medications  ? benzonatate (TESSALON) 100 MG capsule  ?  Sig: Take 1 capsule (100 mg total) by mouth 2 (two)  times daily as needed for cough.  ?  Dispense:  30 capsule  ?  Refill:  0  ? albuterol (VENTOLIN HFA) 108 (90 Base) MCG/ACT inhaler  ?  Sig: Inhale 2 puffs into the lungs every 6 (six) hours as needed for wheezing or shortness of breath.  ?  Dispense:  8 g  ?  Refill:  0  ?  Okay to substitute to generic/formulary Albuterol.  ? ? ? ?Lindell Spar, MD ?

## 2021-07-28 NOTE — Patient Instructions (Addendum)
Please take Tessalon for cough. ? ?Please use Albuterol inhaler as needed for severe cough or shortness of breath or wheezing. ? ?Please take Zyrtec for allergies. ?

## 2021-07-28 NOTE — Assessment & Plan Note (Signed)
Chronic cough seems to be likely due to cough variant asthma ?Will give a trial of albuterol as needed ?Tessalon as needed for cough ?Added Zyrtec for allergies ?Check CT chest as he has chronic cough > 8 weeks, failed empiric treatment with steroid and antibiotic ?

## 2021-08-11 LAB — CBC WITH DIFFERENTIAL/PLATELET
Basophils Absolute: 0.1 10*3/uL (ref 0.0–0.2)
Basos: 1 %
EOS (ABSOLUTE): 0.7 10*3/uL — ABNORMAL HIGH (ref 0.0–0.4)
Eos: 11 %
Hematocrit: 47.8 % (ref 37.5–51.0)
Hemoglobin: 16.1 g/dL (ref 13.0–17.7)
Immature Grans (Abs): 0 10*3/uL (ref 0.0–0.1)
Immature Granulocytes: 0 %
Lymphocytes Absolute: 2.6 10*3/uL (ref 0.7–3.1)
Lymphs: 39 %
MCH: 30.5 pg (ref 26.6–33.0)
MCHC: 33.7 g/dL (ref 31.5–35.7)
MCV: 91 fL (ref 79–97)
Monocytes Absolute: 0.5 10*3/uL (ref 0.1–0.9)
Monocytes: 7 %
Neutrophils Absolute: 2.8 10*3/uL (ref 1.4–7.0)
Neutrophils: 42 %
Platelets: 214 10*3/uL (ref 150–450)
RBC: 5.28 x10E6/uL (ref 4.14–5.80)
RDW: 13 % (ref 11.6–15.4)
WBC: 6.7 10*3/uL (ref 3.4–10.8)

## 2021-08-11 LAB — CMP14+EGFR
ALT: 17 IU/L (ref 0–44)
AST: 17 IU/L (ref 0–40)
Albumin/Globulin Ratio: 1.8 (ref 1.2–2.2)
Albumin: 4.4 g/dL (ref 3.8–4.9)
Alkaline Phosphatase: 43 IU/L — ABNORMAL LOW (ref 44–121)
BUN/Creatinine Ratio: 19 (ref 10–24)
BUN: 13 mg/dL (ref 8–27)
Bilirubin Total: 1 mg/dL (ref 0.0–1.2)
CO2: 24 mmol/L (ref 20–29)
Calcium: 9.3 mg/dL (ref 8.6–10.2)
Chloride: 105 mmol/L (ref 96–106)
Creatinine, Ser: 0.7 mg/dL — ABNORMAL LOW (ref 0.76–1.27)
Globulin, Total: 2.5 g/dL (ref 1.5–4.5)
Glucose: 86 mg/dL (ref 70–99)
Potassium: 4.2 mmol/L (ref 3.5–5.2)
Sodium: 143 mmol/L (ref 134–144)
Total Protein: 6.9 g/dL (ref 6.0–8.5)
eGFR: 105 mL/min/{1.73_m2} (ref >59)

## 2021-08-11 LAB — TSH: TSH: 3.91 u[IU]/mL (ref 0.450–4.500)

## 2021-08-11 LAB — PSA: Prostate Specific Ag, Serum: 1.4 ng/mL (ref 0.0–4.0)

## 2021-08-11 LAB — LIPID PANEL
Chol/HDL Ratio: 3.7 ratio (ref 0.0–5.0)
Cholesterol, Total: 163 mg/dL (ref 100–199)
HDL: 44 mg/dL (ref >39)
LDL Chol Calc (NIH): 99 mg/dL (ref 0–99)
Triglycerides: 109 mg/dL (ref 0–149)
VLDL Cholesterol Cal: 20 mg/dL (ref 5–40)

## 2021-08-11 LAB — HEMOGLOBIN A1C
Est. average glucose Bld gHb Est-mCnc: 108 mg/dL
Hgb A1c MFr Bld: 5.4 % (ref 4.8–5.6)

## 2021-08-11 LAB — HEPATITIS C ANTIBODY: Hep C Virus Ab: NONREACTIVE

## 2021-08-11 LAB — VITAMIN D 25 HYDROXY (VIT D DEFICIENCY, FRACTURES): Vit D, 25-Hydroxy: 30.7 ng/mL (ref 30.0–100.0)

## 2021-08-12 DIAGNOSIS — J189 Pneumonia, unspecified organism: Secondary | ICD-10-CM

## 2021-08-12 HISTORY — DX: Pneumonia, unspecified organism: J18.9

## 2021-08-20 ENCOUNTER — Ambulatory Visit (INDEPENDENT_AMBULATORY_CARE_PROVIDER_SITE_OTHER): Payer: 59 | Admitting: Internal Medicine

## 2021-08-20 ENCOUNTER — Encounter: Payer: Self-pay | Admitting: Internal Medicine

## 2021-08-20 VITALS — BP 132/86 | HR 69 | Resp 18 | Ht 64.0 in | Wt 189.4 lb

## 2021-08-20 DIAGNOSIS — R053 Chronic cough: Secondary | ICD-10-CM

## 2021-08-20 DIAGNOSIS — I10 Essential (primary) hypertension: Secondary | ICD-10-CM

## 2021-08-20 DIAGNOSIS — J45991 Cough variant asthma: Secondary | ICD-10-CM

## 2021-08-20 DIAGNOSIS — Z0001 Encounter for general adult medical examination with abnormal findings: Secondary | ICD-10-CM | POA: Diagnosis not present

## 2021-08-20 MED ORDER — FLUTICASONE-SALMETEROL 100-50 MCG/ACT IN AEPB: 1.0000 | INHALATION_SPRAY | Freq: Two times a day (BID) | RESPIRATORY_TRACT | 3 refills | Status: DC

## 2021-08-20 MED ORDER — AMLODIPINE-OLMESARTAN 10-20 MG PO TABS: 1.0000 | ORAL_TABLET | Freq: Every day | ORAL | 1 refills | Status: DC

## 2021-08-20 MED ORDER — ALBUTEROL SULFATE HFA 108 (90 BASE) MCG/ACT IN AERS: 2.0000 | INHALATION_SPRAY | Freq: Four times a day (QID) | RESPIRATORY_TRACT | 2 refills | Status: DC | PRN

## 2021-08-20 NOTE — Patient Instructions (Signed)
Please use Advair as prescribed.  Please use Albuterol as needed for severe coughing or shortness of breath or wheezing.  Please start taking Amlodipine-Olmesartan 10-20 mg once daily.  Please continue to follow DASH diet and perform moderate exercise/walking at least 150 mins/week.

## 2021-08-20 NOTE — Assessment & Plan Note (Signed)
Chronic cough seems to be likely due to cough variant asthma Added Advair as maintenance treatment Albuterol as needed Tessalon as needed for cough Added Zyrtec for allergies Check CT chest as he has chronic cough > 8 weeks, failed empiric treatment with steroid and antibiotic

## 2021-08-20 NOTE — Assessment & Plan Note (Signed)
Annual exam as documented. Counseling done  re healthy lifestyle involving commitment to 150 minutes exercise per week, heart healthy diet, and attaining healthy weight.The importance of adequate sleep also discussed. Changes in health habits are decided on by the patient with goals and time frames  set for achieving them. Immunization and cancer screening needs are specifically addressed at this visit. 

## 2021-08-20 NOTE — Progress Notes (Signed)
Established Patient Office Visit  Subjective:  Patient ID: Nicholas Howard, male    DOB: April 27, 1960  Age: 61 y.o. MRN: [id number]  CC:  Chief Complaint  Patient presents with   Annual Exam    Annual exam pt still has lingering cough also has had body chills due to working in rain on 07-18-21    HPI Nicholas Howard is a 61 y.o. male with past medical history of HTN, asthma and BPH who presents for annual physical.  HTN: He has been taking 1/2 tablet of amlodipine-olmesartan 10-40 mg daily as he was having dizziness with 1 tablet daily.  His BP was slightly elevated today, but he denies any headache, dizziness, chest pain or palpitations.  Cough variant asthma: He still complains of chronic cough, but denies any dyspnea currently.  He had better response with albuterol inhaler.  He also reports mild chills and nasal congestion since yesterday as he worked outdoors in the rain.  He denies any fever currently.   Past Medical History:  Diagnosis Date   Encounter for screening for malignant neoplasm of prostate 03/19/2019   Hypertension    Trapezius muscle spasm 03/19/2019    Past Surgical History:  Procedure Laterality Date   BACK SURGERY     COLONOSCOPY WITH PROPOFOL N/A 11/13/2019   Procedure: COLONOSCOPY WITH PROPOFOL;  Surgeon: Harvel Quale, MD;  [facility];  Service: Gastroenterology;  Laterality: N/A;  10:00   KNEE SURGERY     POLYPECTOMY  11/13/2019   Procedure: POLYPECTOMY;  Surgeon: Montez Morita, Quillian Quince, MD;  Location[facility];  Service: Gastroenterology;;    Family History  Problem Relation Age of Onset   Hypertension Mother     Social History   Socioeconomic History   Marital status: Married    Spouse name: Not on file   Number of children: 3   Years of education: Not on file   Highest education level: 4th grade  Occupational History    Comment: [employer]   Tobacco Use   Smoking status: Former   Smokeless tobacco: Never   Substance and Sexual Activity   Alcohol use: Not Currently   Drug use: Never   Sexual activity: Not Currently  Other Topics Concern   Not on file  Social History Narrative   Lives with wife and youngest daughter   2 dogs      Enjoys outdoors walking when weather is nice, tv       Diet: tries to avoid pork , overall eats all food groups: limited veggies and fruits     Caffeine: 1 cup of coffee daily, sugar free soda-usually caffeine free   Water: 1 bottle daily       Wears seat belt   Smoke detectors    Does not use phone while driving   Social Determinants of Radio broadcast assistant Strain: Not on file  Food Insecurity: Not on file  Transportation Needs: Not on file  Physical Activity: Not on file  Stress: Not on file  Social Connections: Not on file  Intimate Partner Violence: Not on file    Outpatient Medications Prior to Visit  Medication Sig Dispense Refill   acetaminophen (TYLENOL) 500 MG tablet Take 1,000 mg by mouth every 6 (six) hours as needed for moderate pain or headache.     benzonatate (TESSALON) 100 MG capsule Take 1 capsule (100 mg total) by mouth 2 (two) times daily as needed for cough. 30 capsule 0   cetirizine (  ZYRTEC) 10 MG tablet Take 1 tablet (10 mg total) by mouth daily. 30 tablet 5   omeprazole (PRILOSEC) 40 MG capsule Take 1 capsule (40 mg total) by mouth daily. 30 capsule 3   tamsulosin (FLOMAX) 0.4 MG CAPS capsule Take 1 capsule (0.4 mg total) by mouth daily. 90 capsule 1   albuterol (VENTOLIN HFA) 108 (90 Base) MCG/ACT inhaler Inhale 2 puffs into the lungs every 6 (six) hours as needed for wheezing or shortness of breath. 8 g 0   amLODipine-olmesartan (AZOR) 10-40 MG tablet Take 1 tablet by mouth daily. 90 tablet 1   No facility-administered medications prior to visit.    No Known Allergies  ROS Review of Systems  Constitutional:  Negative for chills and fever.  HENT:  Negative for congestion and sore throat.   Eyes:  Negative for  pain and discharge.  Respiratory:  Positive for cough. Negative for shortness of breath.   Cardiovascular:  Negative for chest pain and palpitations.  Gastrointestinal:  Negative for constipation, diarrhea, nausea and vomiting.  Endocrine: Negative for polydipsia and polyuria.  Genitourinary:  Negative for dysuria and hematuria.  Musculoskeletal:  Negative for neck pain and neck stiffness.  Skin:  Negative for rash.  Neurological:  Negative for dizziness, weakness, numbness and headaches.  Psychiatric/Behavioral:  Negative for agitation and behavioral problems.       Objective:    Physical Exam Vitals reviewed.  Constitutional:      General: He is not in acute distress.    Appearance: He is obese. He is not diaphoretic.  HENT:     Head: Normocephalic and atraumatic.     Nose: Nose normal.     Mouth/Throat:     Mouth: Mucous membranes are moist.  Eyes:     General: No scleral icterus.    Extraocular Movements: Extraocular movements intact.  Cardiovascular:     Rate and Rhythm: Normal rate and regular rhythm.     Pulses: Normal pulses.     Heart sounds: Normal heart sounds. No murmur heard. Pulmonary:     Breath sounds: Wheezing (Mild, diffuse, b/l) present. No rales.  Abdominal:     Palpations: Abdomen is soft.     Tenderness: There is no abdominal tenderness.  Musculoskeletal:     Cervical back: Neck supple. No tenderness.     Right lower leg: No edema.     Left lower leg: No edema.  Skin:    General: Skin is warm.     Findings: No rash.  Neurological:     General: No focal deficit present.     Mental Status: He is alert and oriented to person, place, and time.     Cranial Nerves: No cranial nerve deficit.     Sensory: No sensory deficit.     Motor: No weakness.  Psychiatric:        Mood and Affect: Mood normal.        Behavior: Behavior normal.     BP 132/86 (BP Location: Right Arm, Patient Position: Sitting, Cuff Size: Normal)   Pulse 69   Resp 18   Ht 5'  4" (1.626 m)   Wt 189 lb 6.4 oz (85.9 kg)   SpO2 96%   BMI 32.51 kg/m  Wt Readings from Last 3 Encounters:  08/20/21 189 lb 6.4 oz (85.9 kg)  07/28/21 187 lb 6.4 oz (85 kg)  05/13/21 192 lb (87.1 kg)    Lab Results  Component Value Date   TSH 3.910 08/10/2021  Lab Results  Component Value Date   WBC 6.7 08/10/2021   HGB 16.1 08/10/2021   HCT 47.8 08/10/2021   MCV 91 08/10/2021   PLT 214 08/10/2021   Lab Results  Component Value Date   NA 143 08/10/2021   K 4.2 08/10/2021   CO2 24 08/10/2021   GLUCOSE 86 08/10/2021   BUN 13 08/10/2021   CREATININE 0.70 (L) 08/10/2021   BILITOT 1.0 08/10/2021   ALKPHOS 43 (L) 08/10/2021   AST 17 08/10/2021   ALT 17 08/10/2021   PROT 6.9 08/10/2021   ALBUMIN 4.4 08/10/2021   CALCIUM 9.3 08/10/2021   EGFR 105 08/10/2021   Lab Results  Component Value Date   CHOL 163 08/10/2021   Lab Results  Component Value Date   HDL 44 08/10/2021   Lab Results  Component Value Date   LDLCALC 99 08/10/2021   Lab Results  Component Value Date   TRIG 109 08/10/2021   Lab Results  Component Value Date   CHOLHDL 3.7 08/10/2021   Lab Results  Component Value Date   HGBA1C 5.4 08/10/2021      Assessment & Plan:   Problem List Items Addressed This Visit       Cardiovascular and Mediastinum   Essential hypertension   Relevant Medications   amlodipine-olmesartan (AZOR) 10-20 MG tablet     Respiratory   Cough variant asthma    Chronic cough seems to be likely due to cough variant asthma Added Advair as maintenance treatment Albuterol as needed Tessalon as needed for cough Added Zyrtec for allergies Check CT chest as he has chronic cough > 8 weeks, failed empiric treatment with steroid and antibiotic      Relevant Medications   albuterol (VENTOLIN HFA) 108 (90 Base) MCG/ACT inhaler   fluticasone-salmeterol (ADVAIR) 100-50 MCG/ACT AEPB     Other   Encounter for general adult medical examination with abnormal findings -  Primary    Annual exam as documented. Counseling done  re healthy lifestyle involving commitment to 150 minutes exercise per week, heart healthy diet, and attaining healthy weight.The importance of adequate sleep also discussed. Changes in health habits are decided on by the patient with goals and time frames  set for achieving them. Immunization and cancer screening needs are specifically addressed at this visit.      Other Visit Diagnoses     Chronic cough       Relevant Medications   albuterol (VENTOLIN HFA) 108 (90 Base) MCG/ACT inhaler       Meds ordered this encounter  Medications   amlodipine-olmesartan (AZOR) 10-20 MG tablet    Sig: Take 1 tablet by mouth daily.    Dispense:  90 tablet    Refill:  1   albuterol (VENTOLIN HFA) 108 (90 Base) MCG/ACT inhaler    Sig: Inhale 2 puffs into the lungs every 6 (six) hours as needed for wheezing or shortness of breath.    Dispense:  18 g    Refill:  2    Okay to substitute to generic/formulary Albuterol.   fluticasone-salmeterol (ADVAIR) 100-50 MCG/ACT AEPB    Sig: Inhale 1 puff into the lungs 2 (two) times daily.    Dispense:  1 each    Refill:  3    Follow-up: Return in about 4 months (around 12/20/2021) for HTN and asthma.    Lindell Spar, MD

## 2021-08-24 ENCOUNTER — Ambulatory Visit (HOSPITAL_COMMUNITY)
Admission: RE | Admit: 2021-08-24 | Discharge: 2021-08-24 | Disposition: A | Discharge status: Home / Self Care | Payer: 59 | Source: Ambulatory Visit | Attending: Internal Medicine | Admitting: Internal Medicine

## 2021-08-24 DIAGNOSIS — J45991 Cough variant asthma: Secondary | ICD-10-CM | POA: Insufficient documentation

## 2021-08-24 DIAGNOSIS — R053 Chronic cough: Secondary | ICD-10-CM | POA: Insufficient documentation

## 2021-08-30 ENCOUNTER — Telehealth: Payer: Self-pay

## 2021-08-30 NOTE — Telephone Encounter (Signed)
Patient daughter calling about CT scan results.

## 2021-08-31 ENCOUNTER — Other Ambulatory Visit: Payer: Self-pay | Admitting: Internal Medicine

## 2021-08-31 DIAGNOSIS — J189 Pneumonia, unspecified organism: Secondary | ICD-10-CM

## 2021-08-31 MED ORDER — LEVOFLOXACIN 750 MG PO TABS: 750.0000 mg | ORAL_TABLET | Freq: Every day | ORAL | 0 refills | Status: DC

## 2021-08-31 NOTE — Telephone Encounter (Signed)
Please let them know provider is out of office and we will call upon his return.

## 2021-10-13 ENCOUNTER — Ambulatory Visit (HOSPITAL_COMMUNITY)
Admission: RE | Admit: 2021-10-13 | Discharge: 2021-10-13 | Disposition: A | Discharge status: Home / Self Care | Payer: 59 | Source: Ambulatory Visit | Attending: Internal Medicine | Admitting: Internal Medicine

## 2021-10-13 DIAGNOSIS — J189 Pneumonia, unspecified organism: Secondary | ICD-10-CM | POA: Insufficient documentation

## 2021-10-21 ENCOUNTER — Encounter: Payer: Self-pay | Admitting: Internal Medicine

## 2021-10-22 ENCOUNTER — Other Ambulatory Visit: Payer: Self-pay | Admitting: *Deleted

## 2021-10-22 DIAGNOSIS — J45991 Cough variant asthma: Secondary | ICD-10-CM

## 2021-10-22 DIAGNOSIS — R053 Chronic cough: Secondary | ICD-10-CM

## 2021-10-22 DIAGNOSIS — R052 Subacute cough: Secondary | ICD-10-CM

## 2021-11-25 ENCOUNTER — Encounter: Payer: Self-pay | Admitting: Internal Medicine

## 2021-11-25 ENCOUNTER — Ambulatory Visit: Payer: 59 | Admitting: Internal Medicine

## 2021-11-25 DIAGNOSIS — R053 Chronic cough: Secondary | ICD-10-CM | POA: Diagnosis not present

## 2021-11-25 MED ORDER — PANTOPRAZOLE SODIUM 40 MG PO TBEC: 40.0000 mg | DELAYED_RELEASE_TABLET | Freq: Every day | ORAL | 2 refills | Status: DC

## 2021-11-25 MED ORDER — FAMOTIDINE 20 MG PO TABS: ORAL_TABLET | ORAL | 11 refills | Status: DC

## 2021-11-25 MED ORDER — PREDNISONE 10 MG PO TABS: ORAL_TABLET | ORAL | 0 refills | Status: DC

## 2021-11-25 NOTE — Patient Instructions (Addendum)
Stop advair  Only use your Ventolin (albuterol) as a rescue medication to be used if you can't catch your breath or if you really think it helps the cough  - The less you use it, the better it will work when you need it. - Ok to use up to 2 puffs  every 4 hours if you must but call for immediate appointment if use goes up over your usual need - Don't leave home without it !!  (think of it like the spare tire for your car)   For night time throat tickle/cough try take CHLORPHENIRAMINE  4 mg  ("Allergy Relief" '4mg'$  over the counter at [facility] should be easiest to find in the blue box usually on bottom shelf)   start with just a pill or two an hour before bedtime with  Pepcid 20 mg    Prednisone 10 mg take  4 each am x 2 days,   2 each am x 2 days,  1 each am x 2 days and stop   Pantoprazole (protonix) 40 mg (or omeprazole 40 mg)   Take  30-60 min before first meal of the day   GERD (REFLUX)  is an extremely common cause of respiratory symptoms just like yours , many times with no obvious heartburn at all.    It can be treated with medication, but also with lifestyle changes including elevation of the head of your bed (ideally with 6 -8inch blocks under the headboard of your bed),  Smoking cessation, avoidance of late meals, excessive alcohol, and avoid fatty foods, chocolate, peppermint, colas, red wine, and acidic juices such as orange juice.  NO MINT OR MENTHOL PRODUCTS SO NO COUGH DROPS  USE SUGARLESS CANDY INSTEAD (Jolley ranchers or Stover's or Life Savers) or even ice chips will also do - the key is to swallow to prevent all throat clearing. NO OIL BASED VITAMINS - use powdered substitutes.  Avoid fish oil when coughing.   For cough > Mucinex dm 1200 mg every 12 hours as needed (over the counter)  Please remember to go to the lab department   for your tests - we will call you with the results when they are available.      Please schedule a follow up office visit in 2 weeks,  sooner if needed or we can send you to Dr Patsey Berthold in th the [facility] office    Detener advair  Solo use su Ventolin albuterol como medicamento de rescate para usar si no puede recuperar el aliento o si realmente cree que ayuda a la tos - Cuanto menos lo uses, mejor funcionar cuando lo necesites. - Est bien usar hasta 2 inhalaciones cada 4 horas si es necesario, pero llame para una cita inmediata si el uso supera su necesidad habitual - No salgas de casa sin l!!  (Piense en ello como la llanta de repuesto para su automvil)   Para el cosquilleo / tos de garganta durante la noche, intente tomar CLORFENIRAMINA  4 mg  ("Alivio de la alergia" 4 mg sin receta en [facility] / [facility] debera ser ms fcil de encontrar en la caja azul, generalmente en el estante inferior) comience con solo  una pldora o dos una hora antes de acostarse con  Pepcid 20 mg      Prednisona 10 mg tomar 4 cada am x 2 das, 2 cada am x 2 das  , 1 cada  am x  2 das y Personal assistant de tomar 4 cada am  x 2 das   Pantoprazol (protonix) 40 mg (u omeprazol 40 mg)   Tomar 30-60 min antes de la primera comida del da   La ERGE (REFLUJO) es una causa extremadamente comn de sntomas respiratorios como el suyo, [place] veces sin acidez estomacal obvia en absoluto.   Se puede tratar con medicamentos, pero tambin con cambios en el estilo de vida, incluida la elevacin de la cabecera de su cama (idealmente con bloques de 6 a 8 pulgadas debajo de la cabecera de su cama), dejar de fumar, evitar las comidas tardas, el exceso de alcohol y [employer] alimentos grasos, el chocolate, la menta, las colas, el vino tinto y los jugos cidos como el jugo de [place].  SIN PRODUCTOS DE MENTA O MENTOL, POR LO QUE NO HAY PASTILLAS PARA LA TOS  USE DULCES SIN AZCAR EN SU LUGAR (Jolley ranchers o Stover's o Astronomer) o incluso trozos de hielo tambin servirn: la clave es tragar para evitar que se aclare la garganta. SIN VITAMINAS A BASE DE ACEITE:  use sustitutos en polvo.  Evite el aceite de pescado al toser.   Para la tos > Mucinex dm 1200 mg cada 12 horas segn sea necesario (sin receta)  Recuerde ir al departamento de laboratorio   para sus pruebas, lo llamaremos con los resultados cuando estn disponibles.      Programe una visita de seguimiento al [employer] en 2 semanas, antes si es necesario o podemos enviarlo al Dr. Lendell Caprice en [place].

## 2021-11-25 NOTE — Progress Notes (Addendum)
Gavynn Duvall, male    DOB: 1960-10-08    MRN: [id number]   Brief patient profile:  61  yo hispanic male living same house x 2003  quit smoking 1990s  referred to pulmonary clinic in [place]  11/25/2021 by Posey Pronto  for cough x 04/2021 while on ACEi  around Jul 12 2021      History of Present Illness  11/25/2021  Pulmonary/ 1st office eval/ Bee Hammerschmidt / [place] [facility] maint on advair [id number] Chief Complaint  Patient presents with   Consult    He has some cough x 7 months that is green and worse at night, He was told may have Asthma,   Dyspnea: tired more than sob  Cough: daytime sensation of choking/ globus then productive sometimes at hs min green no change with levaquin, best rx was prednisone   Sleep: flat bed/ one pillow  SABA use:hfa technique very poor/ breathing cough no better on advair dpi  No obvious day to day or daytime pattern/variability or assoc mucus plugs or hemoptysis or cp or chest tightness, subjective wheeze or overt sinus or hb symptoms.     Also denies any obvious fluctuation of symptoms with weather or environmental changes or other aggravating or alleviating factors except as outlined above   No unusual exposure hx or h/o childhood pna/ asthma or knowledge of premature birth.  Current Allergies, Complete Past Medical History, Past Surgical History, Family History, and Social History were reviewed in [employer] record.  ROS  The following are not active complaints unless bolded Hoarseness, sore throat/globus, dysphagia, dental problems, itching, sneezing,  nasal congestion or discharge of excess mucus or purulent secretions, ear ache,   fever, chills, sweats, unintended wt loss or wt gain, classically pleuritic or exertional cp,  orthopnea pnd or arm/hand swelling  or leg swelling, presyncope, palpitations, abdominal pain, anorexia, nausea, vomiting, diarrhea  or change in bowel habits or change in bladder habits, change in stools or change in  urine, dysuria, hematuria,  rash, arthralgias, visual complaints, headache, numbness, weakness or ataxia or problems with walking or coordination,  change in mood or  memory.           Past Medical History:  Diagnosis Date   Encounter for screening for malignant neoplasm of prostate 03/19/2019   Hypertension    Pneumonia 08/2021   one time   Trapezius muscle spasm 03/19/2019    Outpatient Medications Prior to Visit  Medication Sig Dispense Refill   albuterol (VENTOLIN HFA) 108 (90 Base) MCG/ACT inhaler Inhale 2 puffs into the lungs every 6 (six) hours as needed for wheezing or shortness of breath. 18 g 2   amlodipine-olmesartan (AZOR) 10-20 MG tablet Take 1 tablet by mouth daily. 90 tablet 1   cetirizine (ZYRTEC) 10 MG tablet Take 1 tablet (10 mg total) by mouth daily. 30 tablet 5   fluticasone-salmeterol (ADVAIR) 100-50 MCG/ACT AEPB Inhale 1 puff into the lungs 2 (two) times daily. 1 each 3   omeprazole (PRILOSEC) 40 MG capsule Take 1 capsule (40 mg total) by mouth daily. 30 capsule 3   tamsulosin (FLOMAX) 0.4 MG CAPS capsule Take 1 capsule (0.4 mg total) by mouth daily. 90 capsule 1   acetaminophen (TYLENOL) 500 MG tablet Take 1,000 mg by mouth every 6 (six) hours as needed for moderate pain or headache. (Patient not taking: Reported on 11/25/2021)     benzonatate (TESSALON) 100 MG capsule Take 1 capsule (100 mg total) by mouth 2 (two) times daily as  needed for cough. (Patient not taking: Reported on 11/25/2021) 30 capsule 0   levofloxacin (LEVAQUIN) 750 MG tablet Take 1 tablet (750 mg total) by mouth daily. (Patient not taking: Reported on 11/25/2021) 7 tablet 0   No facility-administered medications prior to visit.     Objective:     BP 128/84 (BP Location: Left Arm, Cuff Size: Large)   Pulse 81   Temp 98.8 F (37.1 C)   Ht '5\' 4"'  (1.626 m)   Wt 193 lb (87.5 kg)   SpO2 96% Comment: ra  BMI 33.13 kg/m   SpO2: 96 % (ra)  Amb latino nad / no spont cough    HEENT :  Oropharynx  clear     Nasal turbinates nl    NECK :  without  apparent JVD/ palpable Nodes/TM    LUNGS: no acc muscle use,  Nl contour chest which is clear to A and P bilaterally without cough on insp or exp maneuvers   CV:  RRR  no s3 or murmur or increase in P2, and no edema   ABD:  soft and nontender with nl inspiratory excursion in the supine position. No bruits or organomegaly appreciated   MS:  Nl gait/ ext warm without deformities Or obvious joint restrictions  calf tenderness, cyanosis or clubbing    SKIN: warm and dry without lesions    NEURO:  alert, approp, nl sensorium with  no motor or cerebellar deficits apparent.    I personally reviewed images and agree with radiology impression as follows:  CXR:  8//2/23 No active dz       Assessment   Chronic cough Onset 04/2011 - last dose of ACEi aorund May 2023  - CT chest (not HRCT) ? Bronchiectasis bases 08/24/21 with nl cxr 10/13/21   The most common causes of chronic cough in immunocompetent adults include the following: upper airway cough syndrome (UACS), previously referred to as postnasal drip syndrome (PNDS), which is caused by variety of rhinosinus conditions; (2) asthma; (3) GERD; (4) chronic bronchitis from cigarette smoking or other inhaled environmental irritants; (5) nonasthmatic eosinophilic bronchitis; and (6) bronchiectasis.   These conditions, singly or in combination, have accounted for up to 94% of the causes of chronic cough in prospective studies.   Other conditions have constituted no >6% of the causes in prospective studies These have included bronchogenic carcinoma, chronic interstitial pneumonia, sarcoidosis, left ventricular failure, ACEI-induced cough, and aspiration from a condition associated with pharyngeal dysfunction.    Chronic cough is often simultaneously caused by more than one condition. A single cause has been found from 38 to 82% of the time, multiple causes from 18 to 62%. Multiple  caused cough has been the result of three diseases up to 42% of the time.   I believe he probably has mild underly bronchiectasis but has now developed more of a pattern typical of Upper airway cough syndrome (previously labeled PNDS),  is so named because it's frequently impossible to sort out how much is  CR/sinusitis with freq throat clearing (which can be related to primary GERD)   vs  causing  secondary (" extra esophageal")  GERD from wide swings in gastric pressure that occur with throat clearing, often  promoting self use of mint and menthol lozenges that reduce the lower esophageal sphincter tone and exacerbate the problem further in a cyclical fashion.   These are the same pts (now being labeled as having "irritable larynx syndrome" by some cough centers) who not infrequently have  a history of having failed to tolerate ace inhibitors (as was likely the case here) ,  dry powder inhalers (as may be the case here so will d/c acei )  or biphosphonates or report having atypical/extraesophageal reflux symptoms that don't respond to standard doses of PPI  and are easily confused as having aecopd or asthma flares by even experienced allergists/ pulmonologists (myself included).   rec Stop advair/ use saba prn - The proper method of use, as well as anticipated side effects, of a metered-dose inhaler were discussed and demonstrated to the patient using teach back method.  Max rx for gerd/ pnds with 1st gen H1 blockers per guidelines   >>> also so added 6 day taper off  Prednisone starting at 40 mg per day in case of component of Th-2 driven upper or lower airways inflammation (if cough responds short term only to relapse before return while will on full rx for uacs (as above), then  that would point to allergic rhinitis/ asthma or eos bronchitis as alternative dx)  >> mucinex dm 1200 mg bid prn >>> Labs ordered 11/25/2021  :  allergy screen/ ESR     F/u in 2 weeks, refer to Dr Patsey Berthold if struggling  with language barrier.           Each maintenance medication was reviewed in detail including emphasizing most importantly the difference between maintenance and prns and under what circumstances the prns are to be triggered using an action plan format where appropriate.  Total time for H and P, chart review, counseling, reviewing hfa device(s) and generating customized AVS (with google translator/ as well as in office translator's help)  unique to this office visit / same day charting  > 60 min             Christinia Gully, MD 11/25/2021

## 2021-11-25 NOTE — Assessment & Plan Note (Addendum)
Onset 04/2011 - last dose of ACEi aorund May 2023  - CT chest (not HRCT) ? Bronchiectasis bases 08/24/21 with nl cxr 10/13/21   The most common causes of chronic cough in immunocompetent adults include the following: upper airway cough syndrome (UACS), previously referred to as postnasal drip syndrome (PNDS), which is caused by variety of rhinosinus conditions; (2) asthma; (3) GERD; (4) chronic bronchitis from cigarette smoking or other inhaled environmental irritants; (5) nonasthmatic eosinophilic bronchitis; and (6) bronchiectasis.   These conditions, singly or in combination, have accounted for up to 94% of the causes of chronic cough in prospective studies.   Other conditions have constituted no >6% of the causes in prospective studies These have included bronchogenic carcinoma, chronic interstitial pneumonia, sarcoidosis, left ventricular failure, ACEI-induced cough, and aspiration from a condition associated with pharyngeal dysfunction.    Chronic cough is often simultaneously caused by more than one condition. A single cause has been found from 38 to 82% of the time, multiple causes from 18 to 62%. Multiple caused cough has been the result of three diseases up to 42% of the time.   I believe he probably has mild underly bronchiectasis but has now developed more of a pattern typical of Upper airway cough syndrome (previously labeled PNDS),  is so named because it's frequently impossible to sort out how much is  CR/sinusitis with freq throat clearing (which can be related to primary GERD)   vs  causing  secondary (" extra esophageal")  GERD from wide swings in gastric pressure that occur with throat clearing, often  promoting self use of mint and menthol lozenges that reduce the lower esophageal sphincter tone and exacerbate the problem further in a cyclical fashion.   These are the same pts (now being labeled as having "irritable larynx syndrome" by some cough centers) who not infrequently have a  history of having failed to tolerate ace inhibitors (as was likely the case here) ,  dry powder inhalers (as may be the case here so will d/c acei )  or biphosphonates or report having atypical/extraesophageal reflux symptoms that don't respond to standard doses of PPI  and are easily confused as having aecopd or asthma flares by even experienced allergists/ pulmonologists (myself included).   rec Stop advair/ use saba prn - The proper method of use, as well as anticipated side effects, of a metered-dose inhaler were discussed and demonstrated to the patient using teach back method.  Max rx for gerd/ pnds with 1st gen H1 blockers per guidelines   >>> also so added 6 day taper off  Prednisone starting at 40 mg per day in case of component of Th-2 driven upper or lower airways inflammation (if cough responds short term only to relapse before return while will on full rx for uacs (as above), then  that would point to allergic rhinitis/ asthma or eos bronchitis as alternative dx)  >> mucinex dm 1200 mg bid prn >>> Labs ordered 11/25/2021  :  allergy screen/ ESR     F/u in 2 weeks, refer to Dr Patsey Berthold if struggling with language barrier.           Each maintenance medication was reviewed in detail including emphasizing most importantly the difference between maintenance and prns and under what circumstances the prns are to be triggered using an action plan format where appropriate.  Total time for H and P, chart review, counseling, reviewing hfa device(s) and generating customized AVS (with google translator/ as well as in office  translator's help)  unique to this office visit / same day charting  > 60 min

## 2021-11-28 LAB — CBC WITH DIFFERENTIAL/PLATELET
Basophils Absolute: 0.1 10*3/uL (ref 0.0–0.2)
Basos: 1 %
EOS (ABSOLUTE): 0.4 10*3/uL (ref 0.0–0.4)
Eos: 6 %
Hematocrit: 44.9 % (ref 37.5–51.0)
Hemoglobin: 15.9 g/dL (ref 13.0–17.7)
Immature Grans (Abs): 0 10*3/uL (ref 0.0–0.1)
Immature Granulocytes: 0 %
Lymphocytes Absolute: 2.6 10*3/uL (ref 0.7–3.1)
Lymphs: 40 %
MCH: 31.4 pg (ref 26.6–33.0)
MCHC: 35.4 g/dL (ref 31.5–35.7)
MCV: 89 fL (ref 79–97)
Monocytes Absolute: 0.5 10*3/uL (ref 0.1–0.9)
Monocytes: 7 %
Neutrophils Absolute: 3 10*3/uL (ref 1.4–7.0)
Neutrophils: 46 %
Platelets: 177 10*3/uL (ref 150–450)
RBC: 5.06 x10E6/uL (ref 4.14–5.80)
RDW: 13.1 % (ref 11.6–15.4)
WBC: 6.5 10*3/uL (ref 3.4–10.8)

## 2021-11-28 LAB — IGE: IgE (Immunoglobulin E), Serum: 14 IU/mL (ref 6–495)

## 2021-11-28 LAB — SEDIMENTATION RATE: Sed Rate: 4 mm/hr (ref 0–30)

## 2021-12-08 ENCOUNTER — Encounter: Payer: Self-pay | Admitting: Internal Medicine

## 2021-12-08 ENCOUNTER — Ambulatory Visit: Payer: 59 | Admitting: Internal Medicine

## 2021-12-08 VITALS — BP 142/84 | HR 72 | Temp 97.8°F | Ht 64.0 in | Wt 192.4 lb

## 2021-12-08 DIAGNOSIS — Z23 Encounter for immunization: Secondary | ICD-10-CM

## 2021-12-08 DIAGNOSIS — R053 Chronic cough: Secondary | ICD-10-CM | POA: Diagnosis not present

## 2021-12-08 NOTE — Assessment & Plan Note (Signed)
Onset 04/2011 - last dose of ACEi aorund May 2023  - CT chest (not HRCT) ? Bronchiectasis bases 08/24/21 with nl cxr 10/13/21  - Allergy screen 11/25/21  >  Eos 0.3 /  IgE  14 - rx gerd/ 1st gen H1 blockers per guidelines  11/25/21 > much better 12/08/2021 so rec 6 more weeks then try off gerd rx and see if flares   Improvement off advair and directed at upper airway cough syndrome seems to have helped better than anything before so likely does not have asthma of any type and now that off acei can probably taper the gerd rx as well and if flares can either resume rx or refer to GI but defer this to Dr Posey Pronto  Pulmonary f/u is prn           Each maintenance medication was reviewed in detail including emphasizing most importantly the difference between maintenance and prns and under what circumstances the prns are to be triggered using an action plan format where appropriate.  Total time for H and P, chart review, counseling, reviewing   and generating customized AVS unique to this office visit / same day charting = 24 min

## 2021-12-08 NOTE — Progress Notes (Signed)
Nicholas Howard, male    DOB: [birth year]    MRN: [id number]   Brief patient profile:  61  yo hispanic male living same house x 2003  quit smoking 1990s  referred to pulmonary clinic in [place]  11/25/2021 by Nicholas Howard  for cough x 04/2021 while on ACEi  around Jul 12 2021      History of Present Illness  11/25/2021  Pulmonary/ 1st office eval/ Nicholas Howard / [place] [facility] maint on advair [id number] Chief Complaint  Patient presents with   Consult    He has some cough x 7 months that is green and worse at night, He was told may have Asthma,   Dyspnea: tired more than sob  Cough: daytime sensation of choking/ globus then productive sometimes at hs min green no change with levaquin, best rx was prednisone   Sleep: flat bed/ one pillow  SABA use:hfa technique very poor/ breathing cough no better on advair dpi Rec Stop advair Only use your Ventolin (albuterol) as a rescue medication For night time throat tickle/cough try take CHLORPHENIRAMINE  4 mg   Prednisone 10 mg take  4 each am x 2 days,   2 each am x 2 days,  1 each am x 2 days and stop  Pantoprazole (protonix) 40 mg (or omeprazole 40 mg)   Take  30-60 min before first meal of the day  GERD diet/ bed blocks For cough > Mucinex dm 1200 mg every 12 hours as needed (over the counter) Lab Allergy screen 11/25/21  >  Eos 0.3 /  IgE  14         12/08/2021  f/u ov/[place] office/Nicholas Howard re: cough  04/2021  maint on  gerd rx and 1st gen H1 blockers per guidelines  x  8 mg at hs  Chief Complaint  Patient presents with   Follow-up    Cough is better since last ov    Dyspnea:  more energy/  Not limited by breathing from desired activities   Cough: not a problem daytime/ sporadic minimal vol  Sleeping: better p 1st gen H1 blockers per guidelines   SABA use: not needing  02: none  Covid status: vax x 3      No obvious day to day or daytime variability or assoc excess/ purulent sputum or mucus plugs or hemoptysis or cp or chest tightness,  subjective wheeze or overt sinus or hb symptoms.   Sleeping now  without nocturnal  or early am exacerbation  of respiratory  c/o's or need for noct saba. Also denies any obvious fluctuation of symptoms with weather or environmental changes or other aggravating or alleviating factors except as outlined above   No unusual exposure hx or h/o childhood pna/ asthma or knowledge of premature birth.  Current Allergies, Complete Past Medical History, Past Surgical History, Family History, and Social History were reviewed in [employer] record.  ROS  The following are not active complaints unless bolded Hoarseness, sore throat, dysphagia, dental problems, itching, sneezing,  nasal congestion or discharge of excess mucus or purulent secretions, ear ache,   fever, chills, sweats, unintended wt loss or wt gain, classically pleuritic or exertional cp,  orthopnea pnd or arm/hand swelling  or leg swelling, presyncope, palpitations, abdominal pain, anorexia, nausea, vomiting, diarrhea  or change in bowel habits or change in bladder habits, change in stools or change in urine, dysuria, hematuria,  rash, arthralgias, visual complaints, headache, numbness, weakness or ataxia or problems with walking or coordination,  change in mood or  memory.        Current Meds  Medication Sig   albuterol (VENTOLIN HFA) 108 (90 Base) MCG/ACT inhaler Inhale 2 puffs into the lungs every 6 (six) hours as needed for wheezing or shortness of breath.   amlodipine-olmesartan (AZOR) 10-20 MG tablet Take 1 tablet by mouth daily.   famotidine (PEPCID) 20 MG tablet One after supper   pantoprazole (PROTONIX) 40 MG tablet Take 1 tablet (40 mg total) by mouth daily. Take 30-60 min before first meal of the day   tamsulosin (FLOMAX) 0.4 MG CAPS capsule Take 1 capsule (0.4 mg total) by mouth daily.                   Past Medical History:  Diagnosis Date   Encounter for screening for malignant neoplasm of prostate  03/19/2019   Hypertension    Pneumonia 08/2021   one time   Trapezius muscle spasm 03/19/2019        Objective:     12/08/2021       192   11/25/21 193 lb (87.5 kg)  08/20/21 189 lb 6.4 oz (85.9 kg)  07/28/21 187 lb 6.4 oz (85 kg)      Vital signs reviewed  12/08/2021  - Note at rest 02 sats  96% on RA   General appearance:    amb hispanic male nad   HEENT : Oropharynx  clear      Nasal turbinates nl    NECK :  without  apparent JVD/ palpable Nodes/TM    LUNGS: no acc muscle use,  Nl contour chest which is clear to A and P bilaterally without cough on insp or exp maneuvers   CV:  RRR  no s3 or murmur or increase in P2, and no edema   ABD:  soft and nontender with nl inspiratory excursion in the supine position. No bruits or organomegaly appreciated   MS:  Nl gait/ ext warm without deformities Or obvious joint restrictions  calf tenderness, cyanosis or clubbing    SKIN: warm and dry without lesions    NEURO:  alert, approp, nl sensorium with  no motor or cerebellar deficits apparent.            Assessment

## 2021-12-08 NOTE — Patient Instructions (Addendum)
No change medications for 6 weeks then ok to just use the chlorpheniramine as needed and   taper the acid suppression as follows: 1) stop pantoprazole 2) increase pepcid to 20 mg one twice daily after meals x one week 3) after one week if no cough reduce pepcid to 20 mg after supper x one more week and stop  At any point ok to go back to prior step if get worse    If not all better in 6 weeks then call for referral.

## 2021-12-16 ENCOUNTER — Ambulatory Visit: Payer: 59 | Admitting: Internal Medicine

## 2021-12-20 ENCOUNTER — Ambulatory Visit (INDEPENDENT_AMBULATORY_CARE_PROVIDER_SITE_OTHER): Payer: 59 | Admitting: Internal Medicine

## 2021-12-20 ENCOUNTER — Encounter: Payer: Self-pay | Admitting: Internal Medicine

## 2021-12-20 VITALS — BP 122/82 | HR 82 | Resp 18 | Ht 64.0 in | Wt 193.4 lb

## 2021-12-20 DIAGNOSIS — N401 Enlarged prostate with lower urinary tract symptoms: Secondary | ICD-10-CM | POA: Diagnosis not present

## 2021-12-20 DIAGNOSIS — K219 Gastro-esophageal reflux disease without esophagitis: Secondary | ICD-10-CM

## 2021-12-20 DIAGNOSIS — I10 Essential (primary) hypertension: Secondary | ICD-10-CM | POA: Diagnosis not present

## 2021-12-20 DIAGNOSIS — R3912 Poor urinary stream: Secondary | ICD-10-CM | POA: Diagnosis not present

## 2021-12-20 DIAGNOSIS — R053 Chronic cough: Secondary | ICD-10-CM

## 2021-12-20 NOTE — Assessment & Plan Note (Signed)
BP Readings from Last 1 Encounters:  12/20/21 122/82   Well-controlled with Azor Counseled for compliance with the medications Advised DASH diet and moderate exercise/walking, at least 150 mins/week

## 2021-12-20 NOTE — Patient Instructions (Signed)
Please continue taking medications as prescribed.  Please continue to follow low salt diet and perform moderate exercise/walking at least 150 mins/week.  Okay to alternate between Benadryl, Zyrtec and Claritin. Non-sedative option is Xyzal.

## 2021-12-20 NOTE — Assessment & Plan Note (Signed)
Has had pulmonology evaluation - does not think it is related to asthma Discontinued Advair, continue albuterol as needed for dyspnea or wheezing On Protonix for GERD On Benadryl and Mucinex as needed Advised to try Zyrtec or Claritin alternatively for allergies

## 2021-12-20 NOTE — Assessment & Plan Note (Signed)
Refilled Flomax, needs to take it regularly

## 2021-12-20 NOTE — Assessment & Plan Note (Signed)
Well controlled with Pantoprazole Was started as he was having chronic cough as GERD can also cause dry cough

## 2021-12-20 NOTE — Progress Notes (Signed)
Established Patient Office Visit  Subjective:  Patient ID: Nicholas Howard, male    DOB: [birth year]  Age: 61 y.o. MRN: [id number]  CC:  Chief Complaint  Patient presents with   Follow-up    Follow up HTN and Asthma cough got better but is back thinks its weather related    HPI Nicholas Howard is a 61 y.o. male with past medical history of HTN, asthma and BPH who presents for f/u of his chronic medical conditions.  HTN: BP is well-controlled. Takes medications regularly. Patient denies headache, dizziness, chest pain, dyspnea or palpitations.  Chronic cough: He still complains of chronic cough, but denies any dyspnea currently.  He does report mild wheezing.  He has been using albuterol inhaler as needed for chronic cough or wheezing.  He has been evaluated by pulmonology, and was placed on Benadryl for allergies and Protonix for GERD.  He has also tried prednisone with some relief, but his cough returned after completing prednisone.  Denies any recent fever, chills, nasal congestion or sinus pressure.  He reports worsening of his cough especially with outdoor working.  BPH: He takes Flomax every other day or so.  He reports weak urinary stream and nocturia when he does not take Flomax for few days.  Denies any dysuria or hematuria currently.  Past Medical History:  Diagnosis Date   Encounter for screening for malignant neoplasm of prostate 03/19/2019   Hypertension    Pneumonia 08/2021   one time   Trapezius muscle spasm 03/19/2019    Past Surgical History:  Procedure Laterality Date   BACK SURGERY     COLONOSCOPY WITH PROPOFOL N/A 11/13/2019   Procedure: COLONOSCOPY WITH PROPOFOL;  Surgeon: Harvel Quale, MD;  [facility];  Service: Gastroenterology;  Laterality: N/A;  10:00   KNEE SURGERY     POLYPECTOMY  11/13/2019   Procedure: POLYPECTOMY;  Surgeon: Montez Morita, Quillian Quince, MD;  Location: [facility];  Service: Gastroenterology;;    Family  History  Problem Relation Age of Onset   Hypertension Mother     Social History   Socioeconomic History   Marital status: Married    Spouse name: Not on file   Number of children: 3   Years of education: Not on file   Highest education level: 4th grade  Occupational History    Comment: [employer]   Tobacco Use   Smoking status: Former   Smokeless tobacco: Never  Substance and Sexual Activity   Alcohol use: Not Currently   Drug use: Never   Sexual activity: Not Currently  Other Topics Concern   Not on file  Social History Narrative   Lives with wife and youngest daughter   2 dogs      Enjoys outdoors walking when weather is nice, tv       Diet: tries to avoid pork , overall eats all food groups: limited veggies and fruits     Caffeine: 1 cup of coffee daily, sugar free soda-usually caffeine free   Water: 1 bottle daily       Wears seat belt   Smoke detectors    Does not use phone while driving   Social Determinants of Health   Financial Resource Strain: Not on file  Food Insecurity: Not on file  Transportation Needs: Not on file  Physical Activity: Not on file  Stress: Not on file  Social Connections: Not on file  Intimate Partner Violence: Not on file    Outpatient Medications Prior  to Visit  Medication Sig Dispense Refill   albuterol (VENTOLIN HFA) 108 (90 Base) MCG/ACT inhaler Inhale 2 puffs into the lungs every 6 (six) hours as needed for wheezing or shortness of breath. 18 g 2   amlodipine-olmesartan (AZOR) 10-20 MG tablet Take 1 tablet by mouth daily. 90 tablet 1   famotidine (PEPCID) 20 MG tablet One after supper 30 tablet 11   pantoprazole (PROTONIX) 40 MG tablet Take 1 tablet (40 mg total) by mouth daily. Take 30-60 min before first meal of the day 30 tablet 2   tamsulosin (FLOMAX) 0.4 MG CAPS capsule Take 1 capsule (0.4 mg total) by mouth daily. 90 capsule 1   No facility-administered medications prior to visit.    No Known  Allergies  ROS Review of Systems  Constitutional:  Negative for chills and fever.  HENT:  Negative for congestion and sore throat.   Eyes:  Negative for pain and discharge.  Respiratory:  Positive for cough and wheezing. Negative for shortness of breath.   Cardiovascular:  Negative for chest pain and palpitations.  Gastrointestinal:  Negative for constipation, diarrhea, nausea and vomiting.  Endocrine: Negative for polydipsia and polyuria.  Genitourinary:  Negative for dysuria and hematuria.  Musculoskeletal:  Negative for neck pain and neck stiffness.  Skin:  Negative for rash.  Neurological:  Negative for dizziness, weakness, numbness and headaches.  Psychiatric/Behavioral:  Negative for agitation and behavioral problems.       Objective:    Physical Exam Vitals reviewed.  Constitutional:      General: He is not in acute distress.    Appearance: He is obese. He is not diaphoretic.  HENT:     Head: Normocephalic and atraumatic.     Nose: Nose normal.     Mouth/Throat:     Mouth: Mucous membranes are moist.  Eyes:     General: No scleral icterus.    Extraocular Movements: Extraocular movements intact.  Cardiovascular:     Rate and Rhythm: Normal rate and regular rhythm.     Pulses: Normal pulses.     Heart sounds: Normal heart sounds. No murmur heard. Pulmonary:     Breath sounds: Wheezing (Mild, diffuse, b/l) present. No rales.  Abdominal:     Palpations: Abdomen is soft.     Tenderness: There is no abdominal tenderness.  Musculoskeletal:     Cervical back: Neck supple. No tenderness.     Right lower leg: No edema.     Left lower leg: No edema.  Skin:    General: Skin is warm.     Findings: No rash.  Neurological:     General: No focal deficit present.     Mental Status: He is alert and oriented to person, place, and time.     Cranial Nerves: No cranial nerve deficit.     Sensory: No sensory deficit.     Motor: No weakness.  Psychiatric:        Mood and  Affect: Mood normal.        Behavior: Behavior normal.     BP 122/82 (BP Location: Left Arm, Patient Position: Sitting, Cuff Size: Normal)   Pulse 82   Resp 18   Ht '5\' 4"'  (1.626 m)   Wt 193 lb 6.4 oz (87.7 kg)   SpO2 95%   BMI 33.20 kg/m  Wt Readings from Last 3 Encounters:  12/20/21 193 lb 6.4 oz (87.7 kg)  12/08/21 192 lb 6.4 oz (87.3 kg)  11/25/21 193 lb (87.5 kg)  Lab Results  Component Value Date   TSH 3.910 08/10/2021   Lab Results  Component Value Date   WBC 6.5 11/25/2021   HGB 15.9 11/25/2021   HCT 44.9 11/25/2021   MCV 89 11/25/2021   PLT 177 11/25/2021   Lab Results  Component Value Date   NA 143 08/10/2021   K 4.2 08/10/2021   CO2 24 08/10/2021   GLUCOSE 86 08/10/2021   BUN 13 08/10/2021   CREATININE 0.70 (L) 08/10/2021   BILITOT 1.0 08/10/2021   ALKPHOS 43 (L) 08/10/2021   AST 17 08/10/2021   ALT 17 08/10/2021   PROT 6.9 08/10/2021   ALBUMIN 4.4 08/10/2021   CALCIUM 9.3 08/10/2021   EGFR 105 08/10/2021   Lab Results  Component Value Date   CHOL 163 08/10/2021   Lab Results  Component Value Date   HDL 44 08/10/2021   Lab Results  Component Value Date   LDLCALC 99 08/10/2021   Lab Results  Component Value Date   TRIG 109 08/10/2021   Lab Results  Component Value Date   CHOLHDL 3.7 08/10/2021   Lab Results  Component Value Date   HGBA1C 5.4 08/10/2021      Assessment & Plan:   Problem List Items Addressed This Visit       Cardiovascular and Mediastinum   Essential hypertension    BP Readings from Last 1 Encounters:  12/20/21 122/82  Well-controlled with Azor Counseled for compliance with the medications Advised DASH diet and moderate exercise/walking, at least 150 mins/week        Digestive   GERD (gastroesophageal reflux disease)    Well controlled with Pantoprazole Was started as he was having chronic cough as GERD can also cause dry cough        Genitourinary   Benign prostatic hyperplasia with weak  urinary stream    Refilled Flomax, needs to take it regularly        Other   Chronic cough - Primary    Has had pulmonology evaluation - does not think it is related to asthma Discontinued Advair, continue albuterol as needed for dyspnea or wheezing On Protonix for GERD On Benadryl and Mucinex as needed Advised to try Zyrtec or Claritin alternatively for allergies       No orders of the defined types were placed in this encounter.   Follow-up: Return in about 6 months (around 06/21/2022) for HTN and BPH.    Lindell Spar, MD

## 2022-01-26 ENCOUNTER — Telehealth: Payer: Self-pay | Admitting: Internal Medicine

## 2022-01-26 NOTE — Telephone Encounter (Signed)
Cough has started to come back 3-4 weeks ago. Dry cough. Occ. Coughing up green mucus. Patients daughter states that at last ov Dr. Melvyn Novas mentioned referring patient to Dr. Patsey Berthold in [place] if there was a language barrier. Patient would like to be scheduled with Dr. Patsey Berthold.  Dr. Melvyn Novas and Dr. Patsey Berthold please advise, is this okay with both of you?

## 2022-01-26 NOTE — Telephone Encounter (Signed)
Called and spoke with daughter and she voiced understanding. Made pt appt for next available 03/17/21

## 2022-01-26 NOTE — Telephone Encounter (Signed)
Ok to schedule if that is patient's preference, agree with Dr. Melvyn Novas

## 2022-01-26 NOTE — Telephone Encounter (Signed)
Yes but be sure the pt takes all meds to visit as there may be more than just a language barrier here in terms of accurate dx and medication reconciliation

## 2022-03-05 ENCOUNTER — Other Ambulatory Visit: Payer: Self-pay | Admitting: Internal Medicine

## 2022-03-05 DIAGNOSIS — I10 Essential (primary) hypertension: Secondary | ICD-10-CM

## 2022-03-17 ENCOUNTER — Other Ambulatory Visit
Admission: RE | Admit: 2022-03-17 | Discharge: 2022-03-17 | Disposition: A | Discharge status: Home / Self Care | Payer: 59 | Source: Ambulatory Visit | Attending: Pulmonary Disease | Admitting: Pulmonary Disease

## 2022-03-17 ENCOUNTER — Encounter: Payer: Self-pay | Admitting: Pulmonary Disease

## 2022-03-17 ENCOUNTER — Ambulatory Visit: Payer: 59 | Admitting: Pulmonary Disease

## 2022-03-17 VITALS — BP 122/80 | HR 78 | Temp 98.3°F | Ht 64.0 in | Wt 191.2 lb

## 2022-03-17 DIAGNOSIS — J4550 Severe persistent asthma, uncomplicated: Secondary | ICD-10-CM | POA: Insufficient documentation

## 2022-03-17 DIAGNOSIS — R053 Chronic cough: Secondary | ICD-10-CM | POA: Diagnosis not present

## 2022-03-17 DIAGNOSIS — J3089 Other allergic rhinitis: Secondary | ICD-10-CM | POA: Diagnosis not present

## 2022-03-17 LAB — CBC WITH DIFFERENTIAL/PLATELET
Abs Immature Granulocytes: 0.01 K/uL (ref 0.00–0.07)
Basophils Absolute: 0.1 K/uL (ref 0.0–0.1)
Basophils Relative: 1 %
Eosinophils Absolute: 0.7 K/uL — ABNORMAL HIGH (ref 0.0–0.5)
Eosinophils Relative: 12 %
HCT: 45.1 % (ref 39.0–52.0)
Hemoglobin: 15.7 g/dL (ref 13.0–17.0)
Immature Granulocytes: 0 %
Lymphocytes Relative: 36 %
Lymphs Abs: 2.3 K/uL (ref 0.7–4.0)
MCH: 31 pg (ref 26.0–34.0)
MCHC: 34.8 g/dL (ref 30.0–36.0)
MCV: 89 fL (ref 80.0–100.0)
Monocytes Absolute: 0.5 K/uL (ref 0.1–1.0)
Monocytes Relative: 8 %
Neutro Abs: 2.7 K/uL (ref 1.7–7.7)
Neutrophils Relative %: 43 %
Platelets: 197 K/uL (ref 150–400)
RBC: 5.07 MIL/uL (ref 4.22–5.81)
RDW: 12.6 % (ref 11.5–15.5)
WBC: 6.2 K/uL (ref 4.0–10.5)
nRBC: 0 % (ref 0.0–0.2)

## 2022-03-17 LAB — NITRIC OXIDE: Nitric Oxide: 160

## 2022-03-17 MED ORDER — MOMETASONE FUROATE 50 MCG/ACT NA SUSP: 2.0000 | Freq: Every day | NASAL | 2 refills | Status: DC

## 2022-03-17 MED ORDER — TRELEGY ELLIPTA 200-62.5-25 MCG/ACT IN AEPB: 1.0000 | INHALATION_SPRAY | Freq: Every day | RESPIRATORY_TRACT | 0 refills | Status: DC

## 2022-03-17 MED ORDER — TRELEGY ELLIPTA 200-62.5-25 MCG/ACT IN AEPB: 1.0000 | INHALATION_SPRAY | Freq: Every day | RESPIRATORY_TRACT | 11 refills | Status: DC

## 2022-03-17 NOTE — Progress Notes (Signed)
Subjective:    Patient ID: Nicholas Howard, male    DOB: March 03, 1961, 62 y.o.   MRN: [id number] Patient Care Team: Lindell Spar, MD as PCP - General (Internal Medicine)  Chief Complaint  Patient presents with   Follow-up    Chronic cough with green sputum for a year. Cough mostly at night when he lays down. SOB and wheezing at night when he coughs a lot.     HPI The patient is a 62 year old Hispanic male, lifelong never smoker, who presents for second opinion on the issue of chronic cough present for approximately a year.  The patient had been previously evaluated by Dr. Christinia Gully last encounter with Dr. Melvyn Novas was on 08 December 2021.  Patient was referred to me due to perceived language barrier.  The patient notes that his symptoms are worse at nighttime when he is recumbent.  He was given a trial of PPI for potential gastroesophageal reflux however this did not help.  He was also given a trial of chlorphenamine however this also did not help him.  He does note some relief when he uses albuterol inhaler.  He uses the albuterol approximately 2-3 times per day.  He notes seasonal variation with his symptoms and that during the spring and fall of the year these become more pronounced.  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  He has been noted in the past to have mild eosinophilia on CBC.  His cough can be nonproductive to at times productive of greenish sputum.  He does not endorse any fevers, chills or sweats.  No chest pain though he does note chest tightness.  He was given a trial of Advair in the past but this was discontinued, he cannot tell me why.  He notes that he has "a lot of mucus" that builds up in his chest.  He does not endorse any other symptomatology.  As noted he is a lifelong never smoker.  I have reviewed his prior laboratory data and notes from Dr. Melvyn Novas as well as his primary care notes.   Review of Systems A 10 point review of systems was performed  and it is as noted above otherwise negative.  Past Medical History:  Diagnosis Date   Encounter for screening for malignant neoplasm of prostate 03/19/2019   Hypertension    Pneumonia 08/2021   one time   Trapezius muscle spasm 03/19/2019   Past Surgical History:  Procedure Laterality Date   BACK SURGERY     COLONOSCOPY WITH PROPOFOL N/A 11/13/2019   Procedure: COLONOSCOPY WITH PROPOFOL;  Surgeon: Harvel Quale, MD;  [facility];  Service: Gastroenterology;  Laterality: N/A;  10:00   KNEE SURGERY     POLYPECTOMY  11/13/2019   Procedure: POLYPECTOMY;  Surgeon: Harvel Quale, MD;  [facility];  Service: Gastroenterology;;   Patient Active Problem List   Diagnosis Date Noted   Chronic cough 11/25/2021   Encounter for general adult medical examination with abnormal findings 08/20/2021   Cough variant asthma 07/28/2021   Benign prostatic hyperplasia with weak urinary stream 01/15/2021   Vitamin D deficiency 01/15/2021   GERD (gastroesophageal reflux disease) 11/13/2020   Chronic left-sided low back pain without sciatica 11/13/2020   Encounter for screening for malignant neoplasm of prostate 08/16/2019   Hyperlipemia 04/19/2019   Essential hypertension 03/19/2019   Obesity (BMI 30.0-34.9) 03/19/2019   Family History  Problem Relation Age of Onset   Hypertension Mother  Social History   Tobacco Use   Smoking status: Never   Smokeless tobacco: Never  Substance Use Topics   Alcohol use: Not Currently   .No Known Allergies  Current Meds  Medication Sig   albuterol (VENTOLIN HFA) 108 (90 Base) MCG/ACT inhaler Inhale 2 puffs into the lungs every 6 (six) hours as needed for wheezing or shortness of breath.   amlodipine-olmesartan (AZOR) 10-20 MG tablet Take 1 tablet by mouth once daily   tamsulosin (FLOMAX) 0.4 MG CAPS capsule Take 1 capsule (0.4 mg total) by mouth daily.   Immunization History  Administered Date(s)  Administered   Influenza,inj,Quad PF,6+ Mos 03/19/2019, 02/14/2020, 01/15/2021, 12/08/2021   PFIZER(Purple Top)SARS-COV-2 Vaccination 06/08/2019, 07/02/2019, 02/23/2020   Pneumococcal Conjugate-13 08/16/2019   Tdap 08/26/2013   Zoster Recombinat (Shingrix) 08/14/2020, 11/13/2020      No Known Allergies     Objective:   Physical Exam BP 122/80 (BP Location: Left Arm, Cuff Size: Normal)   Pulse 78   Temp 98.3 F (36.8 C)   Ht '5\' 4"'$  (1.626 m)   Wt 191 lb 3.2 oz (86.7 kg)   SpO2 94%   BMI 32.82 kg/m   SpO2: 94 % O2 Device: None (Room air)   GENERAL: Well-developed, well-nourished Hispanic male, no acute distress, no conversational dyspnea, fully ambulatory. HEAD: Normocephalic, atraumatic.  EYES: Pupils equal, round, reactive to light.  No scleral icterus.  NOSE: Significant turbinate edema with narrowing of the nasal passages, clear nasal discharge. MOUTH: Significant clear postnasal drip.  Dentition intact, oral mucosa moist.  No thrush. NECK: Supple. No thyromegaly. Trachea midline. No JVD.  No adenopathy. PULMONARY: Good air entry bilaterally.  Diffuse end expiratory wheezes and occasional rhonchi, throughout. CARDIOVASCULAR: S1 and S2. Regular rate and rhythm.  No rubs, murmurs or gallops heard. ABDOMEN: Benign. MUSCULOSKELETAL: No joint deformity, no clubbing, no edema.  NEUROLOGIC: No overt focal deficit, no gait disturbance, speech is fluent. SKIN: Intact,warm,dry. PSYCH: Mood and behavior normal.  Lab Results  Component Value Date   NITRICOXIDE 160 03/17/2022  *Evidence of type II inflammation present.      Assessment & Plan:     ICD-10-CM   1. Severe persistent asthma without complication  Y30.16 CBC w/Diff    Allergen Panel (27) + IGE   Significant type II inflammation present Check CBC with differential, allergen panel Trial of Trelegy Ellipta 200, 1 inhalation daily Continue PRN albuterol    2. Chronic cough  R05.3 Nitric oxide   Related to poorly  controlled asthma Management as below    3. Perennial allergic rhinitis  J30.89    Nasal hygiene Nasonex 1 spray to each nostril twice a day     Orders Placed This Encounter  Procedures   CBC w/Diff    Standing Status:   Future    Number of Occurrences:   1    Standing Expiration Date:   06/16/2022   Allergen Panel (27) + IGE    Standing Status:   Future    Number of Occurrences:   1    Standing Expiration Date:   06/16/2022   Nitric oxide   Meds ordered this encounter  Medications   mometasone (NASONEX) 50 MCG/ACT nasal spray    Sig: Place 2 sprays into the nose daily.    Dispense:  1 each    Refill:  2   Fluticasone-Umeclidin-Vilant (TRELEGY ELLIPTA) 200-62.5-25 MCG/ACT AEPB    Sig: Inhale 1 puff into the lungs daily.    Dispense:  28 each  Refill:  11   Fluticasone-Umeclidin-Vilant (TRELEGY ELLIPTA) 200-62.5-25 MCG/ACT AEPB    Sig: Inhale 1 puff into the lungs daily.    Dispense:  14 each    Refill:  0    Order Specific Question:   Lot Number?    Answer:   [id number]    Order Specific Question:   Expiration Date?    Answer:   08/13/2023    Order Specific Question:   Manufacturer?    Answer:   [employer] [[id number]]    Order Specific Question:   Quantity    Answer:   2   We will see the patient in 6 to 8 weeks time.  If he is better compensated at that time we will proceed with ordering PFTs.  Hopefully he will be better compensated.  Avoiding use of systemic steroids per patient preference.  Patient is to contact us prior to follow-up should any new difficulties arise.  Renold Don, MD Advanced Bronchoscopy PCCM [facility]    *This note was dictated using voice recognition software/Dragon.  Despite best efforts to proofread, errors can occur which can change the meaning. Any transcriptional errors that result from this process are unintentional and may not be fully corrected at the time of dictation.

## 2022-03-17 NOTE — Patient Instructions (Addendum)
You have asthma and also have issues with allergic rhinitis.  I think that this is the main issue that causes your cough.  I have sent in a prescription for a nasal inhaler, Nasonex, 1 spray to each nostril twice a day.  I am sending a prescription of an inhaler called Trelegy Ellipta 200, 1 inhalation daily, make sure you rinse your mouth well after you use it.  We are getting some blood work to determine about allergies that may be triggering your symptoms.  For congestion you can use Maximum Strength Mucinex (blue box).  Ginger tea also helps.  We will see you in follow-up in 6 to 8 weeks time.  At that time if you are doing better we will get some breathing tests.   Tienes asma y tambin tienes problemas de rinitis IT consultant. Creo que este es el principal problema que provoca la tos.  Le envi una receta para un inhalador nasal, Nasonex, 1 actuacin en cada fosa nasal dos veces al da.  Le envo una receta de un inhalador llamado Trelegy Ellipta 200, 1 inhalacin diaria, asegrese de enjuagarse bien la boca despus de usarlo.  Realizamos algunos anlisis de sangre para determinar si hay alergias que puedan estar desencadenando sus sntomas.  Para la congestin puede [employer] Mucinex de mxima potencia ([place]). El t de [place].  Nos veremos en seguimiento dentro de 6 a 8 semanas. En ese momento, si est mejor, ordenaremos pruebas de la respiracin.

## 2022-03-20 LAB — ALLERGEN PANEL (27) + IGE
Alternaria Alternata IgE: <0.1 kU/L
Aspergillus Fumigatus IgE: <0.1 kU/L
Bahia Grass IgE: <0.1 kU/L
Bermuda Grass IgE: <0.1 kU/L
Cat Dander IgE: <0.1 kU/L
Cedar, Mountain IgE: <0.1 kU/L
Cladosporium Herbarum IgE: <0.1 kU/L
Cocklebur IgE: <0.1 kU/L
Cockroach, American IgE: <0.1 kU/L
Common Silver Birch IgE: <0.1 kU/L
D Farinae IgE: <0.1 kU/L
D Pteronyssinus IgE: <0.1 kU/L
Dog Dander IgE: <0.1 kU/L
Elm, American IgE: <0.1 kU/L
Hickory, White IgE: <0.1 kU/L
IgE (Immunoglobulin E), Serum: 15 IU/mL (ref 6–495)
Johnson Grass IgE: <0.1 kU/L
Kentucky Bluegrass IgE: <0.1 kU/L
Maple/Box Elder IgE: <0.1 kU/L
Mucor Racemosus IgE: <0.1 kU/L
Oak, White IgE: <0.1 kU/L
Penicillium Chrysogen IgE: <0.1 kU/L
Pigweed, Rough IgE: <0.1 kU/L
Plantain, English IgE: <0.1 kU/L
Ragweed, Short IgE: <0.1 kU/L
Setomelanomma Rostrat: <0.1 kU/L
Timothy Grass IgE: <0.1 kU/L
White Mulberry IgE: <0.1 kU/L

## 2022-05-19 ENCOUNTER — Ambulatory Visit: Payer: 59 | Admitting: Pulmonary Disease

## 2022-05-19 ENCOUNTER — Encounter: Payer: Self-pay | Admitting: Pulmonary Disease

## 2022-05-19 VITALS — BP 120/84 | HR 72 | Temp 98.2°F | Ht 64.0 in | Wt 191.2 lb

## 2022-05-19 DIAGNOSIS — J4550 Severe persistent asthma, uncomplicated: Secondary | ICD-10-CM

## 2022-05-19 DIAGNOSIS — J3089 Other allergic rhinitis: Secondary | ICD-10-CM | POA: Diagnosis not present

## 2022-05-19 DIAGNOSIS — R053 Chronic cough: Secondary | ICD-10-CM

## 2022-05-19 LAB — NITRIC OXIDE: Nitric Oxide: 47

## 2022-05-19 NOTE — Progress Notes (Signed)
Subjective:    Patient ID: Nicholas Howard, male    DOB: September 30, 1960, 62 y.o.   MRN: [id number] Patient Care Team: Lindell Spar, MD as PCP - General (Internal Medicine) Tyler Pita, MD as Consulting Physician (Pulmonary Disease)  Chief Complaint  Patient presents with   Follow-up    Breathing is a lot better. No SOB, wheezing or cough.    HPI The patient is a 62 year old Hispanic male, lifelong never smoker, who presents for follow-up on the issue of chronic cough present for approximately a year.  I first evaluated the patient on 17 March 2022 and at that time he was noted to have significant type II inflammation in the airway with a positive nitric oxide test of 160 ppb. I instituted therapy with Trelegy Ellipta 200, 1 inhalation daily and continued use of as needed albuterol.  The patient was also treated for perennial allergic rhinitis with instructions of nasal hygiene and Nasonex 1 spray to each nostril twice a day.  Since that initial visit and since starting the Trelegy the patient notes that his cough has completely resolved.  He does not have any issues with dyspnea, wheezing or any sputum production.  No hemoptysis.  He states that he feels back to "normal".  He has not had to use albuterol as rescue since Trelegy was started.  Deviously he was using the medication 2-3 times per day.  He notes seasonal variation with his symptoms and that during the spring and fall of the year these become more pronounced. He does not endorse any fevers, chills or sweats.  No chest pain or chest tightness.  Previously he described "a lot of mucus" that builds up in his chest.  This has now resolved.  He does not endorse any other symptomatology.   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  He has been noted in the past to have mild eosinophilia on CBC.  CBC with differential performed for January 2024 confirmed absolute eosinophil count of 700.  Her GYN panel was remarkably  negative.  Review of Systems A 10 point review of systems was performed and it is as noted above otherwise negative.  Patient Active Problem List   Diagnosis Date Noted   Chronic cough 11/25/2021   Encounter for general adult medical examination with abnormal findings 08/20/2021   Cough variant asthma 07/28/2021   Benign prostatic hyperplasia with weak urinary stream 01/15/2021   Vitamin D deficiency 01/15/2021   GERD (gastroesophageal reflux disease) 11/13/2020   Chronic left-sided low back pain without sciatica 11/13/2020   Encounter for screening for malignant neoplasm of prostate 08/16/2019   Hyperlipemia 04/19/2019   Essential hypertension 03/19/2019   Obesity (BMI 30.0-34.9) 03/19/2019   Social History   Tobacco Use   Smoking status: Never   Smokeless tobacco: Never  Substance Use Topics   Alcohol use: Not Currently   No Known Allergies  Current Meds  Medication Sig   albuterol (VENTOLIN HFA) 108 (90 Base) MCG/ACT inhaler Inhale 2 puffs into the lungs every 6 (six) hours as needed for wheezing or shortness of breath.   amlodipine-olmesartan (AZOR) 10-20 MG tablet Take 1 tablet by mouth once daily   Fluticasone-Umeclidin-Vilant (TRELEGY ELLIPTA) 200-62.5-25 MCG/ACT AEPB Inhale 1 puff into the lungs daily.   mometasone (NASONEX) 50 MCG/ACT nasal spray Place 2 sprays into the nose daily.   tamsulosin (FLOMAX) 0.4 MG CAPS capsule Take 1 capsule (0.4 mg total) by mouth daily.   Immunization History  Administered Date(s) Administered   Influenza,inj,Quad PF,6+ Mos 03/19/2019, 02/14/2020, 01/15/2021, 12/08/2021   PFIZER(Purple Top)SARS-COV-2 Vaccination 06/08/2019, 07/02/2019, 02/23/2020   Pneumococcal Conjugate-13 08/16/2019   Tdap 08/26/2013   Zoster Recombinat (Shingrix) 08/14/2020, 11/13/2020       Objective:   Physical Exam BP 120/84 (BP Location: Left Arm, Cuff Size: Normal)   Pulse 72   Temp 98.2 F (36.8 C)   Ht '5\' 4"'$  (1.626 m)   Wt 191 lb 3.2 oz (86.7 kg)    SpO2 97%   BMI 32.82 kg/m   SpO2: 97 % O2 Device: None (Room air)  GENERAL: Well-developed, well-nourished Hispanic male, no acute distress, no conversational dyspnea, fully ambulatory. HEAD: Normocephalic, atraumatic.  EYES: Pupils equal, round, reactive to light.  No scleral icterus.  NOSE: Decrease in turbinate edema, nasal passages patent. MOUTH:Dentition intact, oral mucosa moist.  No thrush. NECK: Supple. No thyromegaly. Trachea midline. No JVD.  No adenopathy. PULMONARY: Good air entry bilaterally.  No adventitious sounds. CARDIOVASCULAR: S1 and S2. Regular rate and rhythm.  No rubs, murmurs or gallops heard. ABDOMEN: Benign. MUSCULOSKELETAL: No joint deformity, no clubbing, no edema.  NEUROLOGIC: No overt focal deficit, no gait disturbance, speech is fluent. SKIN: Intact,warm,dry. PSYCH: Mood and behavior normal.  Lab Results  Component Value Date   NITRICOXIDE 47 05/19/2022       Assessment & Plan:     ICD-10-CM   1. Severe persistent asthma without complication  123XX123 Nitric oxide   Markedly improved and compensated Continue Trelegy Ellipta 200 Continue as needed albuterol Type II inflammation decreased 160>>47    2. Perennial allergic rhinitis  J30.89    Continue nasal hygiene Continue Nasonex    3. Chronic cough  R05.3    RESOLVED Due to poorly compensated asthma previously     Patient is to continue same regimen.  We will see him in follow-up in 3 to 4 months he is to contact us prior to that time should any new difficulties arise.  Renold Don, MD Advanced Bronchoscopy PCCM [facility]    *This note was dictated using voice recognition software/Dragon.  Despite best efforts to proofread, errors can occur which can change the meaning. Any transcriptional errors that result from this process are unintentional and may not be fully corrected at the time of dictation.

## 2022-05-19 NOTE — Patient Instructions (Addendum)
Continue your Trelegy inhaler  Continue your as needed albuterol  Your lungs sounded very clear today.  Your level of inflammation was reduced.  We will see you in follow-up in 3 to 4 months time call sooner should any new problems arise.   Contine con su inhalador Trelegy  Contine con su albuterol segn sea necesario  Tus pulmones sonaban muy claros hoy. Su nivel de inflamacin se redujo.  Nos veremos en seguimiento dentro de 3 a 4 meses, llame antes si surge algn problema nuevo.

## 2022-05-20 ENCOUNTER — Encounter: Payer: Self-pay | Admitting: Pulmonary Disease

## 2022-05-20 DIAGNOSIS — Z1231 Encounter for screening mammogram for malignant neoplasm of breast: Secondary | ICD-10-CM | POA: Diagnosis not present

## 2022-06-12 ENCOUNTER — Other Ambulatory Visit: Payer: Self-pay | Admitting: Internal Medicine

## 2022-06-12 DIAGNOSIS — I10 Essential (primary) hypertension: Secondary | ICD-10-CM

## 2022-06-13 ENCOUNTER — Encounter: Payer: Self-pay | Admitting: Internal Medicine

## 2022-06-13 ENCOUNTER — Ambulatory Visit (INDEPENDENT_AMBULATORY_CARE_PROVIDER_SITE_OTHER): Payer: 59 | Admitting: Internal Medicine

## 2022-06-13 VITALS — BP 132/70 | HR 65 | Ht 64.0 in | Wt 194.2 lb

## 2022-06-13 DIAGNOSIS — E782 Mixed hyperlipidemia: Secondary | ICD-10-CM

## 2022-06-13 DIAGNOSIS — E559 Vitamin D deficiency, unspecified: Secondary | ICD-10-CM | POA: Diagnosis not present

## 2022-06-13 DIAGNOSIS — N401 Enlarged prostate with lower urinary tract symptoms: Secondary | ICD-10-CM

## 2022-06-13 DIAGNOSIS — I10 Essential (primary) hypertension: Secondary | ICD-10-CM | POA: Diagnosis not present

## 2022-06-13 DIAGNOSIS — R3912 Poor urinary stream: Secondary | ICD-10-CM

## 2022-06-13 DIAGNOSIS — J4550 Severe persistent asthma, uncomplicated: Secondary | ICD-10-CM

## 2022-06-13 DIAGNOSIS — R739 Hyperglycemia, unspecified: Secondary | ICD-10-CM | POA: Diagnosis not present

## 2022-06-13 MED ORDER — TAMSULOSIN HCL 0.4 MG PO CAPS: 0.4000 mg | ORAL_CAPSULE | Freq: Every day | ORAL | 3 refills | Status: DC

## 2022-06-13 MED ORDER — AMLODIPINE-OLMESARTAN 10-20 MG PO TABS: 1.0000 | ORAL_TABLET | Freq: Every day | ORAL | 1 refills | Status: DC

## 2022-06-13 MED ORDER — BENZONATATE 200 MG PO CAPS: 200.0000 mg | ORAL_CAPSULE | Freq: Two times a day (BID) | ORAL | 1 refills | Status: DC | PRN

## 2022-06-13 NOTE — Assessment & Plan Note (Addendum)
Well-controlled with Trelegy Uses albuterol as needed for dyspnea or wheezing Still has chronic cough, advised to take Mucinex as needed Tessalon as needed for dry cough Followed by pulmonology

## 2022-06-13 NOTE — Progress Notes (Signed)
Established Patient Office Visit  Subjective:  Patient ID: Nicholas Howard, male    DOB: [birth year]  Age: 62 y.o. MRN: [id number]  CC:  Chief Complaint  Patient presents with   Hypertension    Six month follow up for hypertension     HPI Nicholas Howard is a 62 y.o. male with past medical history of HTN, asthma and BPH who presents for f/u of his chronic medical conditions.  HTN: BP is well-controlled. Takes medications regularly. Patient denies headache, dizziness, chest pain, dyspnea or palpitations.  Asthma: He still complains of chronic cough, but denies any dyspnea currently. He has been using Trelegy and albuterol inhaler as needed for chronic cough or wheezing.  He has been evaluated by pulmonology. Denies any recent fever, chills, nasal congestion or sinus pressure.  He reports worsening of his cough especially with outdoor working.  BPH: He takes Flomax every other day or so.  He reports weak urinary stream and nocturia when he does not take Flomax for few days.  Denies any dysuria or hematuria currently.  Past Medical History:  Diagnosis Date   Encounter for screening for malignant neoplasm of prostate 03/19/2019   Hypertension    Pneumonia 08/2021   one time   Trapezius muscle spasm 03/19/2019    Past Surgical History:  Procedure Laterality Date   BACK SURGERY     COLONOSCOPY WITH PROPOFOL N/A 11/13/2019   Procedure: COLONOSCOPY WITH PROPOFOL;  Surgeon: Harvel Quale, MD;  Location: [facility];  Service: Gastroenterology;  Laterality: N/A;  10:00   KNEE SURGERY     POLYPECTOMY  11/13/2019   Procedure: POLYPECTOMY;  Surgeon: Montez Morita, Quillian Quince, MD;  Location: [facility];  Service: Gastroenterology;;    Family History  Problem Relation Age of Onset   Hypertension Mother     Social History   Socioeconomic History   Marital status: Married    Spouse name: Not on file   Number of children: 3   Years of education: Not on file    Highest education level: 4th grade  Occupational History    Comment: [employer]   Tobacco Use   Smoking status: Never   Smokeless tobacco: Never  Substance and Sexual Activity   Alcohol use: Not Currently   Drug use: Never   Sexual activity: Not Currently  Other Topics Concern   Not on file  Social History Narrative   Lives with wife and youngest daughter   2 dogs      Enjoys outdoors walking when weather is nice, tv       Diet: tries to avoid pork , overall eats all food groups: limited veggies and fruits     Caffeine: 1 cup of coffee daily, sugar free soda-usually caffeine free   Water: 1 bottle daily       Wears seat belt   Smoke detectors    Does not use phone while driving   Social Determinants of Health   Financial Resource Strain: Not on file  Food Insecurity: Not on file  Transportation Needs: Not on file  Physical Activity: Not on file  Stress: Not on file  Social Connections: Not on file  Intimate Partner Violence: Not on file    Outpatient Medications Prior to Visit  Medication Sig Dispense Refill   albuterol (VENTOLIN HFA) 108 (90 Base) MCG/ACT inhaler Inhale 2 puffs into the lungs every 6 (six) hours as needed for wheezing or shortness of breath. 18 g 2   Fluticasone-Umeclidin-Vilant (TRELEGY  ELLIPTA) 200-62.5-25 MCG/ACT AEPB Inhale 1 puff into the lungs daily. 28 each 11   mometasone (NASONEX) 50 MCG/ACT nasal spray Place 2 sprays into the nose daily. 1 each 2   pantoprazole (PROTONIX) 40 MG tablet Take 1 tablet (40 mg total) by mouth daily. Take 30-60 min before first meal of the day (Patient not taking: Reported on 03/17/2022) 30 tablet 2   amlodipine-olmesartan (AZOR) 10-20 MG tablet Take 1 tablet by mouth once daily 90 tablet 0   famotidine (PEPCID) 20 MG tablet One after supper (Patient not taking: Reported on 03/17/2022) 30 tablet 11   Fluticasone-Umeclidin-Vilant (TRELEGY ELLIPTA) 200-62.5-25 MCG/ACT AEPB Inhale 1 puff into the lungs daily. (Patient not  taking: Reported on 05/19/2022) 14 each 0   tamsulosin (FLOMAX) 0.4 MG CAPS capsule Take 1 capsule (0.4 mg total) by mouth daily. 90 capsule 1   No facility-administered medications prior to visit.    No Known Allergies  ROS Review of Systems  Constitutional:  Negative for chills and fever.  HENT:  Negative for congestion and sore throat.   Eyes:  Negative for pain and discharge.  Respiratory:  Positive for cough and wheezing. Negative for shortness of breath.   Cardiovascular:  Negative for chest pain and palpitations.  Gastrointestinal:  Negative for constipation, diarrhea, nausea and vomiting.  Endocrine: Negative for polydipsia and polyuria.  Genitourinary:  Negative for dysuria and hematuria.  Musculoskeletal:  Negative for neck pain and neck stiffness.  Skin:  Negative for rash.  Neurological:  Negative for dizziness, weakness, numbness and headaches.  Psychiatric/Behavioral:  Negative for agitation and behavioral problems.       Objective:    Physical Exam Vitals reviewed.  Constitutional:      General: He is not in acute distress.    Appearance: He is obese. He is not diaphoretic.  HENT:     Head: Normocephalic and atraumatic.     Nose: Nose normal.     Mouth/Throat:     Mouth: Mucous membranes are moist.  Eyes:     General: No scleral icterus.    Extraocular Movements: Extraocular movements intact.  Cardiovascular:     Rate and Rhythm: Normal rate and regular rhythm.     Pulses: Normal pulses.     Heart sounds: Normal heart sounds. No murmur heard. Pulmonary:     Breath sounds: Wheezing (Mild, diffuse, b/l) present. No rales.  Abdominal:     Palpations: Abdomen is soft.     Tenderness: There is no abdominal tenderness.  Musculoskeletal:     Cervical back: Neck supple. No tenderness.     Right lower leg: No edema.     Left lower leg: No edema.  Skin:    General: Skin is warm.     Findings: No rash.  Neurological:     General: No focal deficit present.      Mental Status: He is alert and oriented to person, place, and time.     Cranial Nerves: No cranial nerve deficit.     Sensory: No sensory deficit.     Motor: No weakness.  Psychiatric:        Mood and Affect: Mood normal.        Behavior: Behavior normal.     BP 132/70 (BP Location: Left Arm, Cuff Size: Normal)   Pulse 65   Ht 5\' 4"  (1.626 m)   Wt 194 lb 3.2 oz (88.1 kg)   SpO2 97%   BMI 33.33 kg/m  Wt Readings from Last  3 Encounters:  06/13/22 194 lb 3.2 oz (88.1 kg)  05/19/22 191 lb 3.2 oz (86.7 kg)  03/17/22 191 lb 3.2 oz (86.7 kg)    Lab Results  Component Value Date   TSH 3.910 08/10/2021   Lab Results  Component Value Date   WBC 6.2 03/17/2022   HGB 15.7 03/17/2022   HCT 45.1 03/17/2022   MCV 89.0 03/17/2022   PLT 197 03/17/2022   Lab Results  Component Value Date   NA 143 08/10/2021   K 4.2 08/10/2021   CO2 24 08/10/2021   GLUCOSE 86 08/10/2021   BUN 13 08/10/2021   CREATININE 0.70 (L) 08/10/2021   BILITOT 1.0 08/10/2021   ALKPHOS 43 (L) 08/10/2021   AST 17 08/10/2021   ALT 17 08/10/2021   PROT 6.9 08/10/2021   ALBUMIN 4.4 08/10/2021   CALCIUM 9.3 08/10/2021   EGFR 105 08/10/2021   Lab Results  Component Value Date   CHOL 163 08/10/2021   Lab Results  Component Value Date   HDL 44 08/10/2021   Lab Results  Component Value Date   LDLCALC 99 08/10/2021   Lab Results  Component Value Date   TRIG 109 08/10/2021   Lab Results  Component Value Date   CHOLHDL 3.7 08/10/2021   Lab Results  Component Value Date   HGBA1C 5.4 08/10/2021      Assessment & Plan:   Problem List Items Addressed This Visit       Cardiovascular and Mediastinum   Essential hypertension - Primary    BP Readings from Last 1 Encounters:  06/13/22 132/70  Well-controlled with Azor Counseled for compliance with the medications Advised DASH diet and moderate exercise/walking, at least 150 mins/week      Relevant Medications   amlodipine-olmesartan (AZOR)  10-20 MG tablet   Other Relevant Orders   TSH   CMP14+EGFR   CBC with Differential/Platelet     Respiratory   Severe persistent asthma    Well-controlled with Trelegy Uses albuterol as needed for dyspnea or wheezing Still has chronic cough, advised to take Mucinex as needed Tessalon as needed for dry cough Followed by pulmonology      Relevant Medications   benzonatate (TESSALON) 200 MG capsule   Other Relevant Orders   CBC with Differential/Platelet     Genitourinary   Benign prostatic hyperplasia with weak urinary stream    Refilled Flomax, needs to take it regularly      Relevant Medications   tamsulosin (FLOMAX) 0.4 MG CAPS capsule   Other Relevant Orders   PSA     Other   Hyperlipemia   Relevant Medications   amlodipine-olmesartan (AZOR) 10-20 MG tablet   Other Relevant Orders   Lipid panel   Vitamin D deficiency    . Last vitamin D Lab Results  Component Value Date   VD25OH 30.7 08/10/2021  Continue vitamin D supplement      Relevant Orders   VITAMIN D 25 Hydroxy (Vit-D Deficiency, Fractures)   Other Visit Diagnoses     Hyperglycemia       Relevant Orders   Hemoglobin A1c      Meds ordered this encounter  Medications   amlodipine-olmesartan (AZOR) 10-20 MG tablet    Sig: Take 1 tablet by mouth daily.    Dispense:  90 tablet    Refill:  1   tamsulosin (FLOMAX) 0.4 MG CAPS capsule    Sig: Take 1 capsule (0.4 mg total) by mouth daily.    Dispense:  90 capsule    Refill:  3   benzonatate (TESSALON) 200 MG capsule    Sig: Take 1 capsule (200 mg total) by mouth 2 (two) times daily as needed for cough.    Dispense:  30 capsule    Refill:  1    Follow-up: Return in about 4 months (around 10/13/2022) for Annual physical.    Lindell Spar, MD

## 2022-06-13 NOTE — Patient Instructions (Signed)
Please take Mucinex as needed for cough. Okay to take Tessalon as needed for dry cough.  Please continue to take medications as prescribed.  Please continue to follow low carb diet and perform moderate exercise/walking at least 150 mins/week.  Please get fasting blood tests done before the next visit.

## 2022-06-13 NOTE — Assessment & Plan Note (Signed)
Refilled Flomax, needs to take it regularly ?

## 2022-06-13 NOTE — Assessment & Plan Note (Signed)
.   Last vitamin D Lab Results  Component Value Date   VD25OH 30.7 08/10/2021   Continue vitamin D supplement

## 2022-06-13 NOTE — Assessment & Plan Note (Signed)
BP Readings from Last 1 Encounters:  06/13/22 132/70   Well-controlled with Azor Counseled for compliance with the medications Advised DASH diet and moderate exercise/walking, at least 150 mins/week

## 2022-06-22 ENCOUNTER — Ambulatory Visit: Payer: 59 | Admitting: Internal Medicine

## 2022-09-08 ENCOUNTER — Encounter: Payer: Self-pay | Admitting: Pulmonary Disease

## 2022-09-08 ENCOUNTER — Ambulatory Visit: Payer: 59 | Admitting: Pulmonary Disease

## 2022-09-08 VITALS — BP 120/82 | HR 59 | Temp 97.8°F | Ht 64.0 in | Wt 194.2 lb

## 2022-09-08 DIAGNOSIS — J3089 Other allergic rhinitis: Secondary | ICD-10-CM | POA: Diagnosis not present

## 2022-09-08 DIAGNOSIS — J4550 Severe persistent asthma, uncomplicated: Secondary | ICD-10-CM

## 2022-09-08 DIAGNOSIS — K219 Gastro-esophageal reflux disease without esophagitis: Secondary | ICD-10-CM | POA: Diagnosis not present

## 2022-09-08 LAB — NITRIC OXIDE: Nitric Oxide: 59

## 2022-09-08 NOTE — Progress Notes (Signed)
Subjective:    Patient ID: Nicholas Howard, male    DOB: 1960/10/16, 62 y.o.   MRN: [id number]  Patient Care Team: Nicholas Halon, MD as PCP - General (Internal Medicine) Nicholas Saner, MD as Consulting Physician (Pulmonary Disease)  Chief Complaint  Patient presents with   Follow-up    Breathing is good. No SOB, wheezing or cough. Little congestion.     HPI The patient is a 62 year old Hispanic male, lifelong never smoker, who presents for follow-up on the issue of chronic cough present for approximately a year.  I first evaluated the patient on 17 March 2022 and at that time he was noted to have significant type II inflammation in the airway with a positive nitric oxide test of 160 ppb. I instituted therapy with Trelegy Ellipta 200, 1 inhalation daily and continued use of as needed albuterol.  The patient was also treated for perennial allergic rhinitis with instructions of nasal hygiene and Nasonex 1 spray to each nostril twice a day.  He also was instructed to continue PPI for GERD.  I last saw him on 19 May 2022 at that time nitric oxide was down to 47 ppb.  He was instructed to continue Trelegy Ellipta continue nasal hygiene and Nasonex.  Today he presents for follow-up, he does not have any issues with dyspnea, wheezing or any sputum production.  No hemoptysis.  He is compliant with Trelegy Ellipta he has not had to use albuterol as rescue since Trelegy was started.  He has had however increased nasal congestion and stuffy nose for the last 15 days.  Purulent nasal discharge.  He had discontinued use of Nasonex.  Also discontinued use of PPI.  He does not endorse reflux symptoms.  His cough however has not recurred.    He notes seasonal variation with his symptoms and that during the spring and fall of the year these become more pronounced. He does not endorse any fevers, chills or sweats.  No chest pain or chest tightness.  Previously he described "a lot of mucus" that builds up in  his chest.  This has now resolved.  He does not endorse any other symptomatology.    Review of Systems A 10 point review of systems was performed and it is as noted above otherwise negative.   Patient Active Problem List   Diagnosis Date Noted   Chronic cough 11/25/2021   Encounter for general adult medical examination with abnormal findings 08/20/2021   Severe persistent asthma 07/28/2021   Benign prostatic hyperplasia with weak urinary stream 01/15/2021   Vitamin D deficiency 01/15/2021   GERD (gastroesophageal reflux disease) 11/13/2020   Chronic left-sided low back pain without sciatica 11/13/2020   Encounter for screening for malignant neoplasm of prostate 08/16/2019   Hyperlipemia 04/19/2019   Essential hypertension 03/19/2019   Obesity (BMI 30.0-34.9) 03/19/2019    Social History   Tobacco Use   Smoking status: Never   Smokeless tobacco: Never  Substance Use Topics   Alcohol use: Not Currently    No Known Allergies  Current Meds  Medication Sig   albuterol (VENTOLIN HFA) 108 (90 Base) MCG/ACT inhaler Inhale 2 puffs into the lungs every 6 (six) hours as needed for wheezing or shortness of breath.   amlodipine-olmesartan (AZOR) 10-20 MG tablet Take 1 tablet by mouth daily.   Fluticasone-Umeclidin-Vilant (TRELEGY ELLIPTA) 200-62.5-25 MCG/ACT AEPB Inhale 1 puff into the lungs daily.   tamsulosin (FLOMAX) 0.4 MG CAPS capsule Take 1 capsule (0.4 mg total) by  mouth daily.    Immunization History  Administered Date(s) Administered   Influenza,inj,Quad PF,6+ Mos 03/19/2019, 02/14/2020, 01/15/2021, 12/08/2021   PFIZER(Purple Top)SARS-COV-2 Vaccination 06/08/2019, 07/02/2019, 02/23/2020   Pneumococcal Conjugate-13 08/16/2019   Tdap 08/26/2013   Zoster Recombinat (Shingrix) 08/14/2020, 11/13/2020        Objective:     BP 120/82 (BP Location: Left Arm, Cuff Size: Normal)   Pulse (!) 59   Temp 97.8 F (36.6 C)   Ht 5\' 4"  (1.626 m)   Wt 194 lb 3.2 oz (88.1 kg)    SpO2 96%   BMI 33.33 kg/m   SpO2: 96 % O2 Device: None (Room air)  GENERAL: Well-developed, well-nourished Hispanic male, no acute distress, no conversational dyspnea, fully ambulatory.  Does have a nasal quality to his speech today. HEAD: Normocephalic, atraumatic.  EYES: Pupils equal, round, reactive to light.  No scleral icterus.  NOSE: Turbinate edema, nasal passages patent. MOUTH:Dentition intact, oral mucosa moist.  No thrush. NECK: Supple. No thyromegaly. Trachea midline. No JVD.  No adenopathy. PULMONARY: Good air entry bilaterally.  No adventitious sounds. CARDIOVASCULAR: S1 and S2. Regular rate and rhythm.  No rubs, murmurs or gallops heard. ABDOMEN: Benign. MUSCULOSKELETAL: No joint deformity, no clubbing, no edema.  NEUROLOGIC: No overt focal deficit, no gait disturbance, speech is fluent. SKIN: Intact,warm,dry. PSYCH: Mood and behavior normal.  Lab Results  Component Value Date   NITRICOXIDE 59 09/08/2022  FeNO 160>>47>>59     Assessment & Plan:     ICD-10-CM   1. Severe persistent asthma without complication  J45.50 Nitric oxide   No wheezing noted today Nitric oxide 59 ppb, asthma action plan discussed Continue Trelegy 200, 1 inhalation daily Continue as needed albuterol    2. Perennial allergic rhinitis  J30.89    Resume Nasonex 1 spray twice a day to each nostril Allegra 180 mg, 1 tablet daily    3. Gastroesophageal reflux disease, unspecified whether esophagitis present  K21.9    Asymptomatic Discontinued PPI Continue antireflux measures     Orders Placed This Encounter  Procedures   Nitric oxide    Patient has been advised to continue Trelegy Ellipta.  He needs to step up nasal hygiene to avoid an asthmatic exacerbation triggered by perennial rhinitis flare.  Patient understands need for proper nasal hygiene.  He will resume Nasonex 1 inhalation daily.  He has Allegra at home but has not been taking usually effective for allergic symptoms for him.   Advised him to take Allegra 1 tablet daily.  Will see the patient in follow-up in 6 to 8 weeks time.  Patient may need to start biologic if airway inflammation continues to remain high.    Gailen Shelter, MD Advanced Bronchoscopy PCCM [facility]    *This note was dictated using voice recognition software/Dragon.  Despite best efforts to proofread, errors can occur which can change the meaning. Any transcriptional errors that result from this process are unintentional and may not be fully corrected at the time of dictation.

## 2022-09-08 NOTE — Patient Instructions (Addendum)
Your level of inflammation today was 59 which is a little bit higher than before.  You did not have wheezing today.  You may take Allegra 1 tablet daily for allergy.  Please resume using your Nasonex 1 spray twice a day to each nostril.  You still have refills in the pharmacy.  Continue taking your Trelegy Ellipta 1 inhalation daily.  We will see you in follow-up in 6 to 8 weeks time call sooner should any new problems arise.   Su nivel de inflamacin hoy fue 59, que es un poco ms alto que antes.  No tuviste sibilancias hoy.  Puede tomar Allegra 1 comprimido al da para las [place].  Vuelva a usar su aerosol Nasonex 1 dos veces al da en cada fosa nasal.  An tienes resurtidos en la farmacia.  Contine tomando su inhalacin Trelegy Ellipta 1 diariamente.  Nos veremos en el seguimiento dentro de 6 a 8 semanas, llame antes si surge algn problema nuevo.

## 2022-10-07 ENCOUNTER — Encounter (INDEPENDENT_AMBULATORY_CARE_PROVIDER_SITE_OTHER): Payer: Self-pay | Admitting: *Deleted

## 2022-10-20 DIAGNOSIS — R3912 Poor urinary stream: Secondary | ICD-10-CM | POA: Diagnosis not present

## 2022-10-20 DIAGNOSIS — J4550 Severe persistent asthma, uncomplicated: Secondary | ICD-10-CM | POA: Diagnosis not present

## 2022-10-20 DIAGNOSIS — I10 Essential (primary) hypertension: Secondary | ICD-10-CM | POA: Diagnosis not present

## 2022-10-20 DIAGNOSIS — R739 Hyperglycemia, unspecified: Secondary | ICD-10-CM | POA: Diagnosis not present

## 2022-10-20 DIAGNOSIS — E782 Mixed hyperlipidemia: Secondary | ICD-10-CM | POA: Diagnosis not present

## 2022-10-20 DIAGNOSIS — N401 Enlarged prostate with lower urinary tract symptoms: Secondary | ICD-10-CM | POA: Diagnosis not present

## 2022-10-20 DIAGNOSIS — E559 Vitamin D deficiency, unspecified: Secondary | ICD-10-CM | POA: Diagnosis not present

## 2022-10-24 ENCOUNTER — Ambulatory Visit: Payer: 59 | Admitting: Internal Medicine

## 2022-10-24 ENCOUNTER — Ambulatory Visit (INDEPENDENT_AMBULATORY_CARE_PROVIDER_SITE_OTHER): Payer: 59 | Admitting: Internal Medicine

## 2022-10-24 ENCOUNTER — Encounter: Payer: Self-pay | Admitting: Internal Medicine

## 2022-10-24 VITALS — BP 109/72 | HR 79 | Ht 64.0 in | Wt 193.2 lb

## 2022-10-24 DIAGNOSIS — J4550 Severe persistent asthma, uncomplicated: Secondary | ICD-10-CM

## 2022-10-24 DIAGNOSIS — K219 Gastro-esophageal reflux disease without esophagitis: Secondary | ICD-10-CM

## 2022-10-24 DIAGNOSIS — N401 Enlarged prostate with lower urinary tract symptoms: Secondary | ICD-10-CM

## 2022-10-24 DIAGNOSIS — I10 Essential (primary) hypertension: Secondary | ICD-10-CM

## 2022-10-24 DIAGNOSIS — R3912 Poor urinary stream: Secondary | ICD-10-CM | POA: Diagnosis not present

## 2022-10-24 DIAGNOSIS — Z0001 Encounter for general adult medical examination with abnormal findings: Secondary | ICD-10-CM | POA: Diagnosis not present

## 2022-10-24 DIAGNOSIS — H1031 Unspecified acute conjunctivitis, right eye: Secondary | ICD-10-CM

## 2022-10-24 DIAGNOSIS — E559 Vitamin D deficiency, unspecified: Secondary | ICD-10-CM | POA: Diagnosis not present

## 2022-10-24 MED ORDER — OFLOXACIN 0.3 % OP SOLN: 1.0000 [drp] | Freq: Four times a day (QID) | OPHTHALMIC | 0 refills | Status: DC

## 2022-10-24 MED ORDER — PANTOPRAZOLE SODIUM 40 MG PO TBEC: 40.0000 mg | DELAYED_RELEASE_TABLET | Freq: Every day | ORAL | 2 refills | Status: DC

## 2022-10-24 MED ORDER — AMLODIPINE-OLMESARTAN 10-20 MG PO TABS: 1.0000 | ORAL_TABLET | Freq: Every day | ORAL | 1 refills | Status: DC

## 2022-10-24 NOTE — Assessment & Plan Note (Addendum)
Well-controlled with Trelegy Uses albuterol as needed for dyspnea or wheezing Still has chronic cough, takes Mucinex as needed Tessalon as needed for dry cough Followed by pulmonology

## 2022-10-24 NOTE — Assessment & Plan Note (Signed)
Refilled Flomax, needs to take it regularly 

## 2022-10-24 NOTE — Progress Notes (Signed)
Established Patient Office Visit  Subjective:  Patient ID: Nicholas Howard, male    DOB: 1960/11/01  Age: 62 y.o. MRN: [id number]  CC:  Chief Complaint  Patient presents with   Annual Exam    HPI Nicholas Howard is a 62 y.o. male with past medical history of HTN, asthma and BPH who presents for annual physical.  HTN: He has been taking amlodipine-olmesartan 10-20 mg daily. His BP was slightly wnl today. He denies any headache, dizziness, chest pain or palpitations.  Asthma:  He still complains of chronic cough, but denies any dyspnea currently. He has been using Trelegy and albuterol inhaler as needed for chronic cough or wheezing.  He has been evaluated by pulmonology. Denies any recent fever, chills, nasal congestion or sinus pressure.  He reports worsening of his cough especially with outdoor working.   BPH: He takes Flomax QD now. He reports weak urinary stream and nocturia when he does not take Flomax for few days. Denies any dysuria or hematuria currently.  He has right eye redness, burning and discharge since yesterday.  Denies any foreign body injury to the eye.  Denies any visual disturbance.  Past Medical History:  Diagnosis Date   Encounter for screening for malignant neoplasm of prostate 03/19/2019   Hypertension    Pneumonia 08/2021   one time   Trapezius muscle spasm 03/19/2019    Past Surgical History:  Procedure Laterality Date   BACK SURGERY     COLONOSCOPY WITH PROPOFOL N/A 11/13/2019   Procedure: COLONOSCOPY WITH PROPOFOL;  Surgeon: Dolores Frame, MD;  Location: [facility];  Service: Gastroenterology;  Laterality: N/A;  10:00   KNEE SURGERY     POLYPECTOMY  11/13/2019   Procedure: POLYPECTOMY;  Surgeon: Marguerita Merles, Reuel Boom, MD;  Location: [facility];  Service: Gastroenterology;;    Family History  Problem Relation Age of Onset   Hypertension Mother     Social History   Socioeconomic History   Marital status: Married     Spouse name: Not on file   Number of children: 3   Years of education: Not on file   Highest education level: 4th grade  Occupational History    Comment: [employer]   Tobacco Use   Smoking status: Never   Smokeless tobacco: Never  Substance and Sexual Activity   Alcohol use: Not Currently   Drug use: Never   Sexual activity: Not Currently  Other Topics Concern   Not on file  Social History Narrative   Lives with wife and youngest daughter   2 dogs      Enjoys outdoors walking when weather is nice, tv       Diet: tries to avoid pork , overall eats all food groups: limited veggies and fruits     Caffeine: 1 cup of coffee daily, sugar free soda-usually caffeine free   Water: 1 bottle daily       Wears seat belt   Smoke detectors    Does not use phone while driving   Social Determinants of Corporate investment banker Strain: Not on file  Food Insecurity: Not on file  Transportation Needs: Not on file  Physical Activity: Not on file  Stress: Not on file  Social Connections: Not on file  Intimate Partner Violence: Not on file    Outpatient Medications Prior to Visit  Medication Sig Dispense Refill   albuterol (VENTOLIN HFA) 108 (90 Base) MCG/ACT inhaler Inhale 2 puffs into the lungs every 6 (  six) hours as needed for wheezing or shortness of breath. 18 g 2   benzonatate (TESSALON) 200 MG capsule Take 1 capsule (200 mg total) by mouth 2 (two) times daily as needed for cough. (Patient not taking: Reported on 09/08/2022) 30 capsule 1   Fluticasone-Umeclidin-Vilant (TRELEGY ELLIPTA) 200-62.5-25 MCG/ACT AEPB Inhale 1 puff into the lungs daily. 28 each 11   mometasone (NASONEX) 50 MCG/ACT nasal spray Place 2 sprays into the nose daily. (Patient not taking: Reported on 09/08/2022) 1 each 2   tamsulosin (FLOMAX) 0.4 MG CAPS capsule Take 1 capsule (0.4 mg total) by mouth daily. 90 capsule 3   amlodipine-olmesartan (AZOR) 10-20 MG tablet Take 1 tablet by mouth daily. 90 tablet 1    pantoprazole (PROTONIX) 40 MG tablet Take 1 tablet (40 mg total) by mouth daily. Take 30-60 min before first meal of the day (Patient not taking: Reported on 03/17/2022) 30 tablet 2   No facility-administered medications prior to visit.    No Known Allergies  ROS Review of Systems  Constitutional:  Negative for chills and fever.  HENT:  Negative for congestion and sore throat.   Eyes:  Positive for pain, discharge and redness.  Respiratory:  Positive for cough. Negative for shortness of breath.   Cardiovascular:  Negative for chest pain and palpitations.  Gastrointestinal:  Negative for constipation, diarrhea, nausea and vomiting.  Endocrine: Negative for polydipsia and polyuria.  Genitourinary:  Negative for dysuria and hematuria.  Musculoskeletal:  Negative for neck pain and neck stiffness.  Skin:  Negative for rash.  Neurological:  Negative for dizziness, weakness, numbness and headaches.  Psychiatric/Behavioral:  Negative for agitation and behavioral problems.       Objective:    Physical Exam Vitals reviewed.  Constitutional:      General: He is not in acute distress.    Appearance: He is obese. He is not diaphoretic.  HENT:     Head: Normocephalic and atraumatic.     Nose: Nose normal.     Mouth/Throat:     Mouth: Mucous membranes are moist.  Eyes:     General: No scleral icterus.       Right eye: Discharge present.     Extraocular Movements: Extraocular movements intact.     Conjunctiva/sclera:     Right eye: Right conjunctiva is injected.  Cardiovascular:     Rate and Rhythm: Normal rate and regular rhythm.     Pulses: Normal pulses.     Heart sounds: Normal heart sounds. No murmur heard. Pulmonary:     Breath sounds: Normal breath sounds. No wheezing or rales.  Abdominal:     Palpations: Abdomen is soft.     Tenderness: There is no abdominal tenderness.  Musculoskeletal:     Cervical back: Neck supple. No tenderness.     Right lower leg: No edema.     Left  lower leg: No edema.  Skin:    General: Skin is warm.     Findings: No rash.  Neurological:     General: No focal deficit present.     Mental Status: He is alert and oriented to person, place, and time.     Cranial Nerves: No cranial nerve deficit.     Sensory: No sensory deficit.     Motor: No weakness.  Psychiatric:        Mood and Affect: Mood normal.        Behavior: Behavior normal.     BP 109/72 (BP Location: Left Arm, Patient  Position: Sitting, Cuff Size: Normal)   Pulse 79   Ht 5\' 4"  (1.626 m)   Wt 193 lb 3.2 oz (87.6 kg)   SpO2 95%   BMI 33.16 kg/m  Wt Readings from Last 3 Encounters:  10/24/22 193 lb 3.2 oz (87.6 kg)  09/08/22 194 lb 3.2 oz (88.1 kg)  06/13/22 194 lb 3.2 oz (88.1 kg)    Lab Results  Component Value Date   TSH 3.730 10/20/2022   Lab Results  Component Value Date   WBC 6.0 10/20/2022   HGB 15.1 10/20/2022   HCT 44.7 10/20/2022   MCV 91 10/20/2022   PLT 188 10/20/2022   Lab Results  Component Value Date   NA 141 10/20/2022   K 4.0 10/20/2022   CO2 20 10/20/2022   GLUCOSE 82 10/20/2022   BUN 19 10/20/2022   CREATININE 0.72 (L) 10/20/2022   BILITOT 1.2 10/20/2022   ALKPHOS 43 (L) 10/20/2022   AST 20 10/20/2022   ALT 18 10/20/2022   PROT 6.6 10/20/2022   ALBUMIN 4.2 10/20/2022   CALCIUM 9.1 10/20/2022   EGFR 104 10/20/2022   Lab Results  Component Value Date   CHOL 166 10/20/2022   Lab Results  Component Value Date   HDL 49 10/20/2022   Lab Results  Component Value Date   LDLCALC 101 (H) 10/20/2022   Lab Results  Component Value Date   TRIG 85 10/20/2022   Lab Results  Component Value Date   CHOLHDL 3.4 10/20/2022   Lab Results  Component Value Date   HGBA1C 5.5 10/20/2022      Assessment & Plan:   Problem List Items Addressed This Visit       Cardiovascular and Mediastinum   Essential hypertension    BP Readings from Last 1 Encounters:  10/24/22 109/72   Well-controlled with Azor Counseled for  compliance with the medications Advised DASH diet and moderate exercise/walking, at least 150 mins/week      Relevant Medications   amlodipine-olmesartan (AZOR) 10-20 MG tablet     Respiratory   Severe persistent asthma    Well-controlled with Trelegy Uses albuterol as needed for dyspnea or wheezing Still has chronic cough, takes Mucinex as needed Tessalon as needed for dry cough Followed by pulmonology        Digestive   GERD (gastroesophageal reflux disease)    Well controlled usually Has intermittent bloating -can take PRN Pantoprazole      Relevant Medications   pantoprazole (PROTONIX) 40 MG tablet     Genitourinary   Benign prostatic hyperplasia with weak urinary stream    Refilled Flomax, needs to take it regularly        Other   Vitamin D deficiency     Last vitamin D Lab Results  Component Value Date   VD25OH 23.7 (L) 10/20/2022   Needs to take vitamin D 2000 IU QD      Encounter for general adult medical examination with abnormal findings - Primary    Annual exam as documented. Counseling done  re healthy lifestyle involving commitment to 150 minutes exercise per week, heart healthy diet, and attaining healthy weight.The importance of adequate sleep also discussed. Changes in health habits are decided on by the patient with goals and time frames  set for achieving them. Immunization and cancer screening needs are specifically addressed at this visit.      Acute bacterial conjunctivitis of right eye    Has right eye redness and watery discharge Started Ofloxacin  eyedrops      Relevant Medications   ofloxacin (OCUFLOX) 0.3 % ophthalmic solution    Meds ordered this encounter  Medications   ofloxacin (OCUFLOX) 0.3 % ophthalmic solution    Sig: Place 1 drop into the right eye 4 (four) times daily.    Dispense:  5 mL    Refill:  0   amlodipine-olmesartan (AZOR) 10-20 MG tablet    Sig: Take 1 tablet by mouth daily.    Dispense:  90 tablet     Refill:  1   pantoprazole (PROTONIX) 40 MG tablet    Sig: Take 1 tablet (40 mg total) by mouth daily. Take 30-60 min before first meal of the day    Dispense:  30 tablet    Refill:  2    Follow-up: Return in about 6 months (around 04/26/2023) for HTN.    Anabel Halon, MD

## 2022-10-24 NOTE — Assessment & Plan Note (Signed)
Has right eye redness and watery discharge Started Ofloxacin eyedrops

## 2022-10-24 NOTE — Assessment & Plan Note (Signed)
BP Readings from Last 1 Encounters:  10/24/22 109/72   Well-controlled with Azor Counseled for compliance with the medications Advised DASH diet and moderate exercise/walking, at least 150 mins/week

## 2022-10-24 NOTE — Assessment & Plan Note (Signed)
  Last vitamin D Lab Results  Component Value Date   VD25OH 23.7 (L) 10/20/2022   Needs to take vitamin D 2000 IU QD

## 2022-10-24 NOTE — Patient Instructions (Signed)
Please continue to take medications as prescribed.  Please continue to follow low salt diet and perform moderate exercise/walking at least 150 mins/week. 

## 2022-10-24 NOTE — Assessment & Plan Note (Signed)
Annual exam as documented. Counseling done  re healthy lifestyle involving commitment to 150 minutes exercise per week, heart healthy diet, and attaining healthy weight.The importance of adequate sleep also discussed. Changes in health habits are decided on by the patient with goals and time frames  set for achieving them. Immunization and cancer screening needs are specifically addressed at this visit. 

## 2022-10-24 NOTE — Assessment & Plan Note (Signed)
Well controlled usually Has intermittent bloating -can take PRN Pantoprazole

## 2022-11-01 ENCOUNTER — Ambulatory Visit (INDEPENDENT_AMBULATORY_CARE_PROVIDER_SITE_OTHER): Payer: 59 | Admitting: Pulmonary Disease

## 2022-11-01 ENCOUNTER — Encounter: Payer: Self-pay | Admitting: Pulmonary Disease

## 2022-11-01 VITALS — BP 110/80 | HR 71 | Temp 97.6°F | Ht 64.0 in | Wt 194.0 lb

## 2022-11-01 DIAGNOSIS — K219 Gastro-esophageal reflux disease without esophagitis: Secondary | ICD-10-CM

## 2022-11-01 DIAGNOSIS — R053 Chronic cough: Secondary | ICD-10-CM | POA: Diagnosis not present

## 2022-11-01 DIAGNOSIS — J4550 Severe persistent asthma, uncomplicated: Secondary | ICD-10-CM | POA: Diagnosis not present

## 2022-11-01 DIAGNOSIS — J3089 Other allergic rhinitis: Secondary | ICD-10-CM

## 2022-11-01 LAB — NITRIC OXIDE: Nitric Oxide: 56

## 2022-11-01 NOTE — Progress Notes (Signed)
Subjective:    Patient ID: Nicholas Howard, male    DOB: May 22, 1960, 62 y.o.   MRN: [id number]  Patient Care Team: Anabel Halon, MD as PCP - General (Internal Medicine) Salena Saner, MD as Consulting Physician (Pulmonary Disease)  Chief Complaint  Patient presents with   Follow-up    No SOB, wheezing or cough.    HPI Nicholas Howard is a 62 year old Hispanic male, lifelong never smoker, who presents for follow-up on severe persistent asthma without complication.  I first evaluated the patient on 17 March 2022 and at that time he was noted to have significant type II inflammation in the airway with a positive nitric oxide test of 160 ppb. I instituted therapy with Trelegy Ellipta 200, 1 inhalation daily and continued use of as needed albuterol.  The patient was also treated for perennial allergic rhinitis with instructions of nasal hygiene and Nasonex 1 spray to each nostril twice a day.  He also was instructed to continue PPI for GERD.  I last saw him on 08 September 2022 at that time nitric oxide was 59 ppb.  He was instructed to continue Trelegy Ellipta continue nasal hygiene and Nasonex.  Today he presents for follow-up, he does not have any issues with dyspnea, wheezing or any sputum production.  No hemoptysis.  He is compliant with Trelegy Ellipta he has not had to use albuterol as rescue since Trelegy was started until a week ago when he had URI symptoms and had to use albuterol once to twice per day.  Since that URI he has not had any issues.  He is now recovered.  He has no purulent nasal discharge.  Cough is pretty much resolved, this was a chronic issue for him previously.  No sputum production.  He is using PPI for gastroesophageal reflux symptoms.  He does not endorse reflux symptoms.    He notes seasonal variation with his symptoms and that during the spring and fall of the year these become more pronounced. He does not endorse any fevers, chills or sweats.  No chest pain or chest  tightness.  Previously he described "a lot of mucus" that builds up in his chest.  This has now resolved.  He does not endorse any other symptomatology.   Absolute eosinophils on January 2024 specimen were elevated at 0.7K/microliter.   Review of Systems A 10 point review of systems was performed and it is as noted above otherwise negative.   Patient Active Problem List   Diagnosis Date Noted   Acute bacterial conjunctivitis of right eye 10/24/2022   Chronic cough 11/25/2021   Encounter for general adult medical examination with abnormal findings 08/20/2021   Severe persistent asthma 07/28/2021   Benign prostatic hyperplasia with weak urinary stream 01/15/2021   Vitamin D deficiency 01/15/2021   GERD (gastroesophageal reflux disease) 11/13/2020   Chronic left-sided low back pain without sciatica 11/13/2020   Encounter for screening for malignant neoplasm of prostate 08/16/2019   Hyperlipemia 04/19/2019   Essential hypertension 03/19/2019   Obesity (BMI 30.0-34.9) 03/19/2019    Social History   Tobacco Use   Smoking status: Never   Smokeless tobacco: Never  Substance Use Topics   Alcohol use: Not Currently    No Known Allergies  Current Meds  Medication Sig   albuterol (VENTOLIN HFA) 108 (90 Base) MCG/ACT inhaler Inhale 2 puffs into the lungs every 6 (six) hours as needed for wheezing or shortness of breath.   amlodipine-olmesartan (AZOR) 10-20 MG tablet Take 1  tablet by mouth daily.   Fluticasone-Umeclidin-Vilant (TRELEGY ELLIPTA) 200-62.5-25 MCG/ACT AEPB Inhale 1 puff into the lungs daily.   ofloxacin (OCUFLOX) 0.3 % ophthalmic solution Place 1 drop into the right eye 4 (four) times daily.   pantoprazole (PROTONIX) 40 MG tablet Take 1 tablet (40 mg total) by mouth daily. Take 30-60 min before first meal of the day   tamsulosin (FLOMAX) 0.4 MG CAPS capsule Take 1 capsule (0.4 mg total) by mouth daily.    Immunization History  Administered Date(s) Administered    Influenza,inj,Quad PF,6+ Mos 03/19/2019, 02/14/2020, 01/15/2021, 12/08/2021   PFIZER(Purple Top)SARS-COV-2 Vaccination 06/08/2019, 07/02/2019, 02/23/2020   Pneumococcal Conjugate-13 08/16/2019   Tdap 08/26/2013   Zoster Recombinant(Shingrix) 08/14/2020, 11/13/2020        Objective:     BP 110/80 (BP Location: Left Arm, Cuff Size: Normal)   Pulse 71   Temp 97.6 F (36.4 C)   Ht 5\' 4"  (1.626 m)   Wt 194 lb (88 kg)   SpO2 97%   BMI 33.30 kg/m   SpO2: 97 % O2 Device: None (Room air)  GENERAL: Well-developed, well-nourished Hispanic male, no acute distress, no conversational dyspnea, fully ambulatory.  Does have a nasal quality to his speech today. HEAD: Normocephalic, atraumatic.  EYES: Pupils equal, round, reactive to light.  No scleral icterus.  NOSE: Turbinate edema, nasal passages patent. MOUTH:Dentition intact, oral mucosa moist.  No thrush. NECK: Supple. No thyromegaly. Trachea midline. No JVD.  No adenopathy. PULMONARY: Good air entry bilaterally.  No adventitious sounds. CARDIOVASCULAR: S1 and S2. Regular rate and rhythm.  No rubs, murmurs or gallops heard. ABDOMEN: Benign. MUSCULOSKELETAL: No joint deformity, no clubbing, no edema.  NEUROLOGIC: No overt focal deficit, no gait disturbance, speech is fluent. SKIN: Intact,warm,dry. PSYCH: Mood and behavior normal.  Lab Results  Component Value Date   NITRICOXIDE 56 11/01/2022  FeNO 160>>47>>59 >>56     Assessment & Plan:     ICD-10-CM   1. 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 Perennial allergic rhinitis  J30.89    Continue nasal hygiene Consider addition of montelukast if symptoms persist    3. Gastroesophageal reflux disease, unspecified whether esophagitis present  K21.9    Continue PPI (Protonix) Continue antireflux measures      Overall patient is doing well.  Continue current regimen.   If nasal symptoms persist would recommend addition of montelukast.  Will see the patient in follow-up in 4 to 6 months time call sooner should any new problems arise.    Gailen Shelter, MD [facility]    *This note was dictated using voice recognition software/Dragon.  Despite best efforts to proofread, errors can occur which can change the meaning. Any transcriptional errors that result from this process are unintentional and may not be fully corrected at the time of dictation.

## 2022-11-01 NOTE — Patient Instructions (Addendum)
Your level of airway inflammation is lower than the last time.  This is good.  Your lungs sounded clear today.  You still need to keep taking your Trelegy once daily.  You can use your albuterol (rescue inhaler) only as needed.  Times when you would needed would be for shortness of breath, chest tightness or cough.  We will see her in follow-up in 4 to 6 months time.   Su nivel de inflamacin de las vas respiratorias es menos que la ltima vez.  Esto es bueno.  Tus pulmones sonaban claros hoy.  An debe seguir tomando Trelegy una vez al da.  Puede usar su albuterol (inhalador de rescate) solo cuando sea necesario.  Los momentos en los que lo necesitara seran por dificultad para respirar, opresin en el pecho o tos.  La veremos en seguimiento dentro de 4 a 6 meses.

## 2023-01-18 ENCOUNTER — Telehealth: Payer: Self-pay | Admitting: *Deleted

## 2023-01-18 NOTE — Telephone Encounter (Signed)
Room 1 Thanks

## 2023-01-18 NOTE — Telephone Encounter (Signed)
Who is your primary care physician: Dr. Mila Homer number where you can be reached: [phone] (daughter)   Reasons for the colonoscopy: 3 yr recall  Have you had a colonoscopy before?  11/13/2019, Dr. Levon Hedger  Do you have family history of colon cancer? no  Previous colonoscopy with polyps removed? yes  Do you have a history colorectal cancer?   no  Are you diabetic? If yes, Type 1 or Type 2?    no  Do you have a prosthetic or mechanical heart valve? no  Do you have a pacemaker/defibrillator?   no  Have you had endocarditis/atrial fibrillation? no  Have you had joint replacement within the last 12 months?  no  Do you tend to be constipated or have to use laxatives? no  Do you have any history of drugs or alchohol?  no  Do you use supplemental oxygen?  no  Have you had a stroke or heart attack within the last 6 months? no  Do you take weight loss medication?  no  Do you take any blood-thinning medications such as: (aspirin, warfarin, Plavix, Aggrenox)  no  If yes we need the name, milligram, dosage and who is prescribing doctor  Current Outpatient Medications on File Prior to Visit  Medication Sig Dispense Refill   albuterol (VENTOLIN HFA) 108 (90 Base) MCG/ACT inhaler Inhale 2 puffs into the lungs every 6 (six) hours as needed for wheezing or shortness of breath. 18 g 2   amlodipine-olmesartan (AZOR) 10-20 MG tablet Take 1 tablet by mouth daily. 90 tablet 1   Cholecalciferol (VITAMIN D) 50 MCG (2000 UT) CAPS Take by mouth daily.     Fluticasone-Umeclidin-Vilant (TRELEGY ELLIPTA) 200-62.5-25 MCG/ACT AEPB Inhale 1 puff into the lungs daily. 28 each 11   pantoprazole (PROTONIX) 40 MG tablet Take 1 tablet (40 mg total) by mouth daily. Take 30-60 min before first meal of the day 30 tablet 2   tamsulosin (FLOMAX) 0.4 MG CAPS capsule Take 1 capsule (0.4 mg total) by mouth daily. 90 capsule 3   No current facility-administered medications on file prior to visit.    No  Known Allergies   [facility]  Primary Insurance Name: [employer] [facility]

## 2023-01-20 MED ORDER — PEG 3350-KCL-NA BICARB-NACL 420 G PO SOLR: 4000.0000 mL | Freq: Once | ORAL | 0 refills | Status: AC

## 2023-01-20 NOTE — Telephone Encounter (Signed)
Called daughter [phone], LMOVM to call back to schedule

## 2023-01-20 NOTE — Telephone Encounter (Signed)
Questionnaire from recall, no referral needed  

## 2023-01-20 NOTE — Telephone Encounter (Signed)
Daughter called back. Scheduled for 11/22. She is aware will send rx to pharmacy. She is coming to office next week to pick up patent instructions at [address]

## 2023-01-20 NOTE — Addendum Note (Signed)
Addended by: Armstead Peaks on: 01/20/2023 08:50 AM   Modules accepted: Orders

## 2023-02-03 ENCOUNTER — Encounter (HOSPITAL_COMMUNITY)
Admission: RE | Disposition: A | Discharge status: Home / Self Care | Payer: Self-pay | Source: Home / Self Care | Attending: Gastroenterology

## 2023-02-03 ENCOUNTER — Other Ambulatory Visit: Payer: Self-pay

## 2023-02-03 ENCOUNTER — Ambulatory Visit (HOSPITAL_BASED_OUTPATIENT_CLINIC_OR_DEPARTMENT_OTHER): Payer: 59 | Admitting: Anesthesiology

## 2023-02-03 ENCOUNTER — Ambulatory Visit (HOSPITAL_COMMUNITY): Payer: 59 | Admitting: Anesthesiology

## 2023-02-03 ENCOUNTER — Ambulatory Visit (HOSPITAL_COMMUNITY)
Admission: RE | Admit: 2023-02-03 | Discharge: 2023-02-03 | Disposition: A | Discharge status: Home / Self Care | Payer: 59 | Attending: Gastroenterology | Admitting: Gastroenterology

## 2023-02-03 ENCOUNTER — Encounter (HOSPITAL_COMMUNITY): Payer: Self-pay | Admitting: Gastroenterology

## 2023-02-03 DIAGNOSIS — D125 Benign neoplasm of sigmoid colon: Secondary | ICD-10-CM | POA: Insufficient documentation

## 2023-02-03 DIAGNOSIS — J45909 Unspecified asthma, uncomplicated: Secondary | ICD-10-CM | POA: Insufficient documentation

## 2023-02-03 DIAGNOSIS — D122 Benign neoplasm of ascending colon: Secondary | ICD-10-CM | POA: Diagnosis not present

## 2023-02-03 DIAGNOSIS — K635 Polyp of colon: Secondary | ICD-10-CM | POA: Diagnosis not present

## 2023-02-03 DIAGNOSIS — Z1211 Encounter for screening for malignant neoplasm of colon: Secondary | ICD-10-CM

## 2023-02-03 DIAGNOSIS — Z860101 Personal history of adenomatous and serrated colon polyps: Secondary | ICD-10-CM

## 2023-02-03 DIAGNOSIS — Z08 Encounter for follow-up examination after completed treatment for malignant neoplasm: Secondary | ICD-10-CM | POA: Diagnosis not present

## 2023-02-03 DIAGNOSIS — K219 Gastro-esophageal reflux disease without esophagitis: Secondary | ICD-10-CM | POA: Diagnosis not present

## 2023-02-03 DIAGNOSIS — I10 Essential (primary) hypertension: Secondary | ICD-10-CM | POA: Diagnosis not present

## 2023-02-03 DIAGNOSIS — Z860100 Personal history of colon polyps, unspecified: Secondary | ICD-10-CM

## 2023-02-03 HISTORY — PX: POLYPECTOMY: SHX5525

## 2023-02-03 HISTORY — PX: COLONOSCOPY WITH PROPOFOL: SHX5780

## 2023-02-03 LAB — HM COLONOSCOPY

## 2023-02-03 SURGERY — COLONOSCOPY WITH PROPOFOL
Anesthesia: General

## 2023-02-03 MED ORDER — PROPOFOL 10 MG/ML IV BOLUS
INTRAVENOUS | Status: DC | PRN
  Administered 2023-02-03: 50 mg via INTRAVENOUS
  Administered 2023-02-03: 80 mg via INTRAVENOUS

## 2023-02-03 MED ORDER — PHENYLEPHRINE 80 MCG/ML (10ML) SYRINGE FOR IV PUSH (FOR BLOOD PRESSURE SUPPORT)
PREFILLED_SYRINGE | INTRAVENOUS | Status: DC | PRN
Start: 2023-02-03 — End: 2023-02-03
  Administered 2023-02-03: 160 ug via INTRAVENOUS

## 2023-02-03 MED ORDER — PHENYLEPHRINE 80 MCG/ML (10ML) SYRINGE FOR IV PUSH (FOR BLOOD PRESSURE SUPPORT)
PREFILLED_SYRINGE | INTRAVENOUS | Status: AC
  Filled 2023-02-03: qty 10

## 2023-02-03 MED ORDER — LIDOCAINE HCL (PF) 2 % IJ SOLN
INTRAMUSCULAR | Status: AC
  Filled 2023-02-03: qty 5

## 2023-02-03 MED ORDER — PROPOFOL 10 MG/ML IV BOLUS
INTRAVENOUS | Status: AC
  Filled 2023-02-03: qty 40

## 2023-02-03 MED ORDER — LACTATED RINGERS IV SOLN: INTRAVENOUS | Status: DC

## 2023-02-03 MED ORDER — EPHEDRINE SULFATE-NACL 50-0.9 MG/10ML-% IV SOSY
PREFILLED_SYRINGE | INTRAVENOUS | Status: DC | PRN
  Administered 2023-02-03: 10 mg via INTRAVENOUS

## 2023-02-03 MED ORDER — PROPOFOL 500 MG/50ML IV EMUL
INTRAVENOUS | Status: DC | PRN
  Administered 2023-02-03: 150 ug/kg/min via INTRAVENOUS

## 2023-02-03 MED ORDER — EPHEDRINE 5 MG/ML INJ
INTRAVENOUS | Status: AC
  Filled 2023-02-03: qty 5

## 2023-02-03 MED ORDER — LIDOCAINE HCL (CARDIAC) PF 100 MG/5ML IV SOSY
PREFILLED_SYRINGE | INTRAVENOUS | Status: DC | PRN
  Administered 2023-02-03: 60 mg via INTRAVENOUS

## 2023-02-03 NOTE — Anesthesia Postprocedure Evaluation (Signed)
Anesthesia Post Note  Patient: Nicholas Howard  Procedure(s) Performed: COLONOSCOPY WITH PROPOFOL POLYPECTOMY  Patient location during evaluation: Phase II Anesthesia Type: General Level of consciousness: awake Pain management: pain level controlled Vital Signs Assessment: post-procedure vital signs reviewed and stable Respiratory status: spontaneous breathing and respiratory function stable Cardiovascular status: blood pressure returned to baseline and stable Postop Assessment: no headache and no apparent nausea or vomiting Anesthetic complications: no Comments: Late entry   No notable events documented.   Last Vitals:  Vitals:   02/03/23 1230 02/03/23 1507  BP: 138/85 116/75  Pulse: 86 87  Resp: 19 19  Temp: 36.6 C (!) 36.4 C  SpO2: 97% 99%    Last Pain:  Vitals:   02/03/23 1507  TempSrc: Oral  PainSc: 0-No pain                 [name]

## 2023-02-03 NOTE — H&P (Signed)
Nicholas Howard is an 62 y.o. male.   Chief Complaint: history colon polyps HPI: 62 year old male with past medical history of hypertension, coming for surveillance of history of colon polyps.  Last colonoscopy in 2021, had a 15 mm polyp, tubular adenoma.  The patient denies having any nausea, vomiting, fever, chills, hematochezia, melena, hematemesis, abdominal distention, abdominal pain, diarrhea, jaundice, pruritus or weight loss.   Past Medical History:  Diagnosis Date   Encounter for screening for malignant neoplasm of prostate 03/19/2019   Hypertension    Pneumonia 08/2021   one time   Trapezius muscle spasm 03/19/2019    Past Surgical History:  Procedure Laterality Date   BACK SURGERY     COLONOSCOPY WITH PROPOFOL N/A 11/13/2019   Procedure: COLONOSCOPY WITH PROPOFOL;  Surgeon: Dolores Frame, MD;  Location: [facility];  Service: Gastroenterology;  Laterality: N/A;  10:00   KNEE SURGERY     POLYPECTOMY  11/13/2019   Procedure: POLYPECTOMY;  Surgeon: Dolores Frame, MD;  Location: [facility];  Service: Gastroenterology;;    Family History  Problem Relation Age of Onset   Hypertension Mother    Social History:  reports that he has never smoked. He has never used smokeless tobacco. He reports that he does not currently use alcohol. He reports that he does not use drugs.  Allergies: No Known Allergies  Medications Prior to Admission  Medication Sig Dispense Refill   albuterol (VENTOLIN HFA) 108 (90 Base) MCG/ACT inhaler Inhale 2 puffs into the lungs every 6 (six) hours as needed for wheezing or shortness of breath. 18 g 2   amlodipine-olmesartan (AZOR) 10-20 MG tablet Take 1 tablet by mouth daily. 90 tablet 1   Cholecalciferol (VITAMIN D) 50 MCG (2000 UT) CAPS Take by mouth daily.     pantoprazole (PROTONIX) 40 MG tablet Take 1 tablet (40 mg total) by mouth daily. Take 30-60 min before first meal of the day 30 tablet 2   tamsulosin (FLOMAX) 0.4  MG CAPS capsule Take 1 capsule (0.4 mg total) by mouth daily. 90 capsule 3   Fluticasone-Umeclidin-Vilant (TRELEGY ELLIPTA) 200-62.5-25 MCG/ACT AEPB Inhale 1 puff into the lungs daily. 28 each 11    No results found for this or any previous visit (from the past 48 hour(s)). No results found.  Review of Systems  All other systems reviewed and are negative.   Blood pressure 138/85, pulse 86, temperature 97.9 F (36.6 C), temperature source Oral, resp. rate 19, height 5\' 4"  (1.626 m), weight 88 kg, SpO2 97%. Physical Exam  GENERAL: The patient is AO x3, in no acute distress. HEENT: Head is normocephalic and atraumatic. EOMI are intact. Mouth is well hydrated and without lesions. NECK: Supple. No masses LUNGS: Clear to auscultation. No presence of rhonchi/wheezing/rales. Adequate chest expansion HEART: RRR, normal s1 and s2. ABDOMEN: Soft, nontender, no guarding, no peritoneal signs, and nondistended. BS +. No masses. EXTREMITIES: Without any cyanosis, clubbing, rash, lesions or edema. NEUROLOGIC: AOx3, no focal motor deficit. SKIN: no jaundice, no rashes  Assessment/Plan 62 year old male with past medical history of hypertension, coming for surveillance of history of colon polyps.  Will proceed with colonoscopy.  Dolores Frame, MD 02/03/2023, 1:01 PM

## 2023-02-03 NOTE — Discharge Instructions (Signed)
You are being discharged to home.  Resume your previous diet.  We are waiting for your pathology results.  Your physician has recommended a repeat colonoscopy in five years for surveillance.

## 2023-02-03 NOTE — Transfer of Care (Addendum)
Immediate Anesthesia Transfer of Care Note  Patient: Nicholas Howard  Procedure(s) Performed: COLONOSCOPY WITH PROPOFOL POLYPECTOMY  Patient Location: Endoscopy Unit  Anesthesia Type:General  Level of Consciousness: awake and patient cooperative  Airway & Oxygen Therapy: Patient Spontanous Breathing and Patient connected to nasal cannula oxygen  Post-op Assessment: Report given to RN and Post -op Vital signs reviewed and stable  Post vital signs: Reviewed and stable  Last Vitals:  Vitals Value Taken Time  BP 116/75 02/03/23   1507  Temp 36.4 02/03/23   1507  Pulse 87 02/03/23   1507  Resp 19 02/03/23   1507  SpO2 99% 02/03/23   1507      Last Pain:  Vitals:   02/03/23 1230  TempSrc: Oral  PainSc: 0-No pain      Patients Stated Pain Goal: 5 (02/03/23 1230)  Complications: No notable events documented.

## 2023-02-03 NOTE — Anesthesia Preprocedure Evaluation (Signed)
Anesthesia Evaluation  Patient identified by MRN, date of birth, ID band Patient awake    Reviewed: Allergy & Precautions, H&P , NPO status , Patient's Chart, lab work & pertinent test results, reviewed documented beta blocker date and time   Airway Mallampati: II  TM Distance: >3 FB Neck ROM: full    Dental no notable dental hx.    Pulmonary neg pulmonary ROS, asthma , pneumonia   Pulmonary exam normal breath sounds clear to auscultation       Cardiovascular Exercise Tolerance: Good hypertension, negative cardio ROS  Rhythm:regular Rate:Normal     Neuro/Psych  Neuromuscular disease negative neurological ROS  negative psych ROS   GI/Hepatic negative GI ROS, Neg liver ROS,GERD  ,,  Endo/Other  negative endocrine ROS    Renal/GU negative Renal ROS  negative genitourinary   Musculoskeletal   Abdominal   Peds  Hematology negative hematology ROS (+)   Anesthesia Other Findings   Reproductive/Obstetrics negative OB ROS                             Anesthesia Physical Anesthesia Plan  ASA: 2  Anesthesia Plan: General   Post-op Pain Management:    Induction:   PONV Risk Score and Plan: Propofol infusion  Airway Management Planned:   Additional Equipment:   Intra-op Plan:   Post-operative Plan:   Informed Consent: I have reviewed the patients History and Physical, chart, labs and discussed the procedure including the risks, benefits and alternatives for the proposed anesthesia with the patient or authorized representative who has indicated his/her understanding and acceptance.     Dental Advisory Given  Plan Discussed with: CRNA  Anesthesia Plan Comments:        Anesthesia Quick Evaluation

## 2023-02-03 NOTE — Op Note (Signed)
[facility] Patient Name: Nicholas Howard Procedure Date: 02/03/2023 2:32 PM MRN: [id number] Date of Birth: 1960-09-30 Attending MD: Katrinka Blazing , , [id number] CSN: [id number] Age: 62 Admit Type: Outpatient Procedure:                Colonoscopy Indications:              Surveillance: Personal history of adenomatous                            polyps on last colonoscopy 3 years ago Providers:                Katrinka Blazing, Nena Polio, RN, Zena Amos Referring MD:              Medicines:                Monitored Anesthesia Care Complications:            No immediate complications. Estimated Blood Loss:     Estimated blood loss: none. Procedure:                Pre-Anesthesia Assessment:                           - Prior to the procedure, a History and Physical                            was performed, and patient medications, allergies                            and sensitivities were reviewed. The patient's                            tolerance of previous anesthesia was reviewed.                           - The risks and benefits of the procedure and the                            sedation options and risks were discussed with the                            patient. All questions were answered and informed                            consent was obtained.                           - ASA Grade Assessment: II - A patient with mild                            systemic disease.                           After obtaining informed consent, the colonoscope                            was passed under direct  vision. Throughout the                            procedure, the patient's blood pressure, pulse, and                            oxygen saturations were monitored continuously. The                            [id number]-HQ190L ([id number]) scope was introduced through                            the anus and advanced to the the cecum, identified                            by appendiceal  orifice and ileocecal valve. The                            colonoscopy was performed without difficulty. The                            patient tolerated the procedure well. The quality                            of the bowel preparation was good. Scope In: 2:42:59 PM Scope Out: 2:59:16 PM Scope Withdrawal Time: 0 hours 12 minutes 11 seconds  Total Procedure Duration: 0 hours 16 minutes 17 seconds  Findings:      The perianal and digital rectal examinations were normal.      A 2 mm polyp was found in the ascending colon. The polyp was sessile.       The polyp was removed with a cold snare. Resection and retrieval were       complete.      A 4 mm polyp was found in the sigmoid colon. The polyp was sessile. The       polyp was removed with a cold snare. Resection and retrieval were       complete.      The retroflexed view of the distal rectum and anal verge was normal and       showed no anal or rectal abnormalities. Impression:               - One 2 mm polyp in the ascending colon, removed                            with a cold snare. Resected and retrieved.                           - One 4 mm polyp in the sigmoid colon, removed with                            a cold snare. Resected and retrieved.                           - The distal rectum and anal verge are normal on  retroflexion view. Moderate Sedation:      Per Anesthesia Care Recommendation:           - Discharge patient to home (ambulatory).                           - Resume previous diet.                           - Await pathology results.                           - Repeat colonoscopy in 5 years for surveillance. Procedure Code(s):        --- Professional ---                           [phone], Colonoscopy, flexible; with removal of                            tumor(s), polyp(s), or other lesion(s) by snare                            technique Diagnosis Code(s):        --- Professional ---                            Z86.010, Personal history of colonic polyps                           D12.2, Benign neoplasm of ascending colon                           D12.5, Benign neoplasm of sigmoid colon CPT copyright 2022 American Medical Association. All rights reserved. The codes documented in this report are preliminary and upon coder review may  be revised to meet current compliance requirements. Katrinka Blazing, MD Katrinka Blazing,  02/03/2023 3:04:14 PM This report has been signed electronically. Number of Addenda: 0

## 2023-02-06 ENCOUNTER — Encounter (INDEPENDENT_AMBULATORY_CARE_PROVIDER_SITE_OTHER): Payer: Self-pay | Admitting: *Deleted

## 2023-02-07 LAB — SURGICAL PATHOLOGY

## 2023-02-08 ENCOUNTER — Encounter (INDEPENDENT_AMBULATORY_CARE_PROVIDER_SITE_OTHER): Payer: Self-pay | Admitting: *Deleted

## 2023-02-13 ENCOUNTER — Encounter (HOSPITAL_COMMUNITY): Payer: Self-pay | Admitting: Gastroenterology

## 2023-02-16 ENCOUNTER — Ambulatory Visit: Payer: 59 | Admitting: Pulmonary Disease

## 2023-02-16 ENCOUNTER — Encounter: Payer: Self-pay | Admitting: Pulmonary Disease

## 2023-02-16 VITALS — BP 136/86 | HR 66 | Temp 97.7°F | Ht 64.0 in | Wt 198.4 lb

## 2023-02-16 DIAGNOSIS — J4550 Severe persistent asthma, uncomplicated: Secondary | ICD-10-CM | POA: Diagnosis not present

## 2023-02-16 DIAGNOSIS — J3089 Other allergic rhinitis: Secondary | ICD-10-CM | POA: Diagnosis not present

## 2023-02-16 DIAGNOSIS — R053 Chronic cough: Secondary | ICD-10-CM

## 2023-02-16 DIAGNOSIS — K2100 Gastro-esophageal reflux disease with esophagitis, without bleeding: Secondary | ICD-10-CM

## 2023-02-16 LAB — NITRIC OXIDE: Nitric Oxide: 52

## 2023-02-16 MED ORDER — TRELEGY ELLIPTA 200-62.5-25 MCG/ACT IN AEPB: 1.0000 | INHALATION_SPRAY | Freq: Every day | RESPIRATORY_TRACT | 11 refills | Status: AC

## 2023-02-16 MED ORDER — PANTOPRAZOLE SODIUM 40 MG PO TBEC: 40.0000 mg | DELAYED_RELEASE_TABLET | Freq: Every day | ORAL | 2 refills | Status: AC

## 2023-02-16 MED ORDER — ALBUTEROL SULFATE HFA 108 (90 BASE) MCG/ACT IN AERS: 2.0000 | INHALATION_SPRAY | Freq: Four times a day (QID) | RESPIRATORY_TRACT | 2 refills | Status: AC | PRN

## 2023-02-16 NOTE — Progress Notes (Signed)
Subjective:    Patient ID: Nicholas Howard, male    DOB: [birth year], 62 y.o.   MRN: [id number]  Patient Care Team: Anabel Halon, MD as PCP - General (Internal Medicine) Salena Saner, MD as Consulting Physician (Pulmonary Disease)  Chief Complaint  Patient presents with   Follow-up    Shortness of breath on exertion. No cough or wheezing.     BACKGROUND/INTERVAL:Nicholas Howard is a 63 year old Hispanic male, lifelong never smoker, who presents for follow-up on severe persistent asthma without complication.  I first evaluated the patient on 17 March 2022 and at that time he was noted to have significant type II inflammation in the airway with a positive nitric oxide test of 160 ppb. I instituted therapy with Trelegy Ellipta 200, 1 inhalation daily and continued use of as needed albuterol.  The patient was last seen on 01 November 2022 noting that he was doing well at that time.  No major exacerbations since that visit.  HPI Discussed the use of AI scribe software for clinical note transcription with the patient, who gave verbal consent to proceed.  History of Present Illness   The patient, diagnosed with severe persistent asthma of eosinophilic phenotype, perennial allergic rhinitis, and gastroesophageal reflux with esophagitis, reports no recent need for an emergency inhaler. He has been regularly using Trelegy, and does not endorse any current respiratory distress.  Previously, the patient experienced esophageal irritation when consuming spicy foods, which was managed with Protonix and occasional Tums. He reports no current symptoms of heartburn or reflux.  The patient had a history of chronic cough, which has significantly improved following asthma management. He mentions a recent episode of flu, which was accompanied by a mild cough, but this resolved without complications.  The patient's nitric oxide level has been improving, indicating better control of his asthma. He has been  adhering to his prescribed medications, including Trelegy, albuterol, and Protonix, and has requested refills for these medications.        Review of Systems A 10 point review of systems was performed and it is as noted above otherwise negative.   Patient Active Problem List   Diagnosis Date Noted   History of colonic polyps 02/03/2023   Acute bacterial conjunctivitis of right eye 10/24/2022   Chronic cough 11/25/2021   Encounter for general adult medical examination with abnormal findings 08/20/2021   Severe persistent asthma 07/28/2021   Benign prostatic hyperplasia with weak urinary stream 01/15/2021   Vitamin D deficiency 01/15/2021   GERD (gastroesophageal reflux disease) 11/13/2020   Chronic left-sided low back pain without sciatica 11/13/2020   Encounter for screening for malignant neoplasm of prostate 08/16/2019   Hyperlipemia 04/19/2019   Essential hypertension 03/19/2019   Obesity (BMI 30.0-34.9) 03/19/2019    Social History   Tobacco Use   Smoking status: Never   Smokeless tobacco: Never  Substance Use Topics   Alcohol use: Not Currently    No Known Allergies  Current Meds  Medication Sig   albuterol (VENTOLIN HFA) 108 (90 Base) MCG/ACT inhaler Inhale 2 puffs into the lungs every 6 (six) hours as needed for wheezing or shortness of breath.   amlodipine-olmesartan (AZOR) 10-20 MG tablet Take 1 tablet by mouth daily.   Cholecalciferol (VITAMIN D) 50 MCG (2000 UT) CAPS Take by mouth daily.   Fluticasone-Umeclidin-Vilant (TRELEGY ELLIPTA) 200-62.5-25 MCG/ACT AEPB Inhale 1 puff into the lungs daily.   pantoprazole (PROTONIX) 40 MG tablet Take 1 tablet (40 mg total) by mouth daily. Take 30-60 min  before first meal of the day   tamsulosin (FLOMAX) 0.4 MG CAPS capsule Take 1 capsule (0.4 mg total) by mouth daily.    Immunization History  Administered Date(s) Administered   Influenza,inj,Quad PF,6+ Mos 03/19/2019, 02/14/2020, 01/15/2021, 12/08/2021   PFIZER(Purple  Top)SARS-COV-2 Vaccination 06/08/2019, 07/02/2019, 02/23/2020   Pneumococcal Conjugate-13 08/16/2019   Tdap 08/26/2013   Zoster Recombinant(Shingrix) 08/14/2020, 11/13/2020        Objective:   BP 136/86 (BP Location: Left Arm, Patient Position: Sitting, Cuff Size: Normal)   Pulse 66   Temp 97.7 F (36.5 C) (Temporal)   Ht 5\' 4"  (1.626 m)   Wt 198 lb 6.4 oz (90 kg)   SpO2 96%   BMI 34.06 kg/m   SpO2: 96 %  GENERAL: Well-developed, well-nourished Hispanic male, no acute distress, no conversational dyspnea, fully ambulatory.  Does have a nasal quality to his speech today. HEAD: Normocephalic, atraumatic.  EYES: Pupils equal, round, reactive to light.  No scleral icterus.  NOSE: Turbinate edema, nasal passages patent. MOUTH:Dentition intact, oral mucosa moist.  No thrush. NECK: Supple. No thyromegaly. Trachea midline. No JVD.  No adenopathy. PULMONARY: Good air entry bilaterally.  No adventitious sounds. CARDIOVASCULAR: S1 and S2. Regular rate and rhythm.  No rubs, murmurs or gallops heard. ABDOMEN: Benign. MUSCULOSKELETAL: No joint deformity, no clubbing, no edema.  NEUROLOGIC: No overt focal deficit, no gait disturbance, speech is fluent. SKIN: Intact,warm,dry. PSYCH: Mood and behavior normal.   Lab Results  Component Value Date   NITRICOXIDE 52 02/16/2023  *160>>47>>59 >>56>>52   Assessment & Plan:     ICD-10-CM   1. Severe persistent asthma without complication - eosinophilic phenotype  J45.50     2. Perennial allergic rhinitis  J30.89     3. Gastroesophageal reflux disease, unspecified whether esophagitis present  K21.9      Orders Placed This Encounter  Procedures   Nitric oxide   Meds ordered this encounter  Medications   Fluticasone-Umeclidin-Vilant (TRELEGY ELLIPTA) 200-62.5-25 MCG/ACT AEPB    Sig: Inhale 1 puff into the lungs daily.    Dispense:  28 each    Refill:  11   albuterol (VENTOLIN HFA) 108 (90 Base) MCG/ACT inhaler    Sig: Inhale 2 puffs  into the lungs every 6 (six) hours as needed for wheezing or shortness of breath.    Dispense:  18 g    Refill:  2    Okay to substitute to generic/formulary Albuterol.   pantoprazole (PROTONIX) 40 MG tablet    Sig: Take 1 tablet (40 mg total) by mouth daily. Take 30-60 min before first meal of the day    Dispense:  30 tablet    Refill:  2   Discussion:    Severe Persistent Asthma, Eosinophilic Phenotype   Severe persistent asthma with eosinophilic phenotype. Reports no recent use of emergency inhaler and good control of symptoms with Trelegy. Nitric oxide level today is 52, showing improvement. No current issues with cough. Discussed that GERD can exacerbate asthma symptoms, emphasizing the importance of managing reflux.   - Refill Trelegy   - Refill albuterol   - Follow-up in six months    Gastroesophageal Reflux Disease (GERD) with Esophagitis   GERD with esophagitis. Reports occasional symptoms when consuming spicy foods, managed with Protonix and occasional use of Tums. Discussed that managing GERD is crucial to prevent exacerbation of asthma symptoms.   - Refill Protonix   - Continue current management with Protonix and Tums as needed    Perennial  Allergic Rhinitis   Perennial allergic rhinitis. No specific discussion of current symptoms or management changes.    General Health Maintenance   Has not yet received the influenza vaccine this season but plans to get it at his primary care appointment.   - Receive influenza vaccine at primary care appointment    Follow-up   - Follow-up in six months.    Gailen Shelter, MD Advanced Bronchoscopy P[facility]    *This note was generated using voice recognition software/Dragon and/or AI transcription program.  Despite best efforts to proofread, errors can occur which can change the meaning. Any transcriptional errors that result from this process are unintentional and may not be fully corrected at the time  of dictation.

## 2023-02-16 NOTE — Patient Instructions (Addendum)
VISIT SUMMARY:  During today's visit, we reviewed your severe persistent asthma, gastroesophageal reflux disease (GERD) with esophagitis, and perennial allergic rhinitis. You reported good control of your asthma symptoms with Trelegy and no recent need for an emergency inhaler. Your GERD symptoms are well-managed with Protonix and occasional Tums, and you have no current symptoms of heartburn or reflux. We also discussed the importance of managing GERD to prevent exacerbation of asthma symptoms. You mentioned planning to receive your influenza vaccine at your primary care appointment.  YOUR PLAN:  -SEVERE PERSISTENT ASTHMA, EOSINOPHILIC PHENOTYPE: Severe persistent asthma with eosinophilic phenotype is a type of asthma characterized by high levels of eosinophils, a type of white blood cell, which can cause inflammation and asthma symptoms. Your asthma is well-controlled with Trelegy, and your nitric oxide level has improved. Continue using Trelegy and albuterol as prescribed. We will follow up in six months.  -GASTROESOPHAGEAL REFLUX DISEASE (GERD) WITH ESOPHAGITIS: GERD with esophagitis is a condition where stomach acid frequently flows back into the tube connecting your mouth and stomach, causing irritation. Your symptoms are managed with Protonix and occasional Tums. Continue with your current management plan and avoid spicy foods to prevent symptoms.  -PERENNIAL ALLERGIC RHINITIS: Perennial allergic rhinitis is a condition where you have year-round allergy symptoms such as a runny or stuffy nose. There were no specific changes to your management plan discussed today.  -GENERAL HEALTH MAINTENANCE: You have not yet received your influenza vaccine this season. It is important to get vaccinated to protect yourself from the flu. Please receive the influenza vaccine at your primary care appointment.  INSTRUCTIONS:  Please follow up in six months for a review of your asthma, GERD, and allergic rhinitis.  Continue taking your medications as prescribed and get your influenza vaccine at your primary care appointment.   RESUMEN DE LA VISITA:  Durante la visita de hoy, revisamos su asma persistente grave, enfermedad por reflujo gastroesofgico (ERGE) con esofagitis y rinitis alrgica perenne. Inform un buen control de sus sntomas de asma con Trelegy y no necesita recientemente un inhalador de emergencia. Sus sntomas de [employer] se controlan bien con Protonix y Biomedical scientist, y actualmente no tiene sntomas de [place] laboratory technician o reflujo. Tambin discutimos la importancia de Scientist, physiological ERGE para prevenir la exacerbacin de los sntomas del asma. Mencion que planea recibir su vacuna contra la influenza en su cita de atencin primaria.   -ASMA PERSISTENTE GRAVE, FENOTIPO EOSINOFLICO: El asma persistente grave con fenotipo eosinoflico es un tipo de asma caracterizada por niveles elevados de eosinfilos, un tipo de glbulo blanco, que puede provocar inflamacin y sntomas de asma. Su asma est bien controlada con Trelegy y su nivel de xido ntrico ha mejorado. Contine usando Trelegy y albuterol segn lo recetado. Haremos un seguimiento en seis meses.  -ENFERMEDAD POR REFLUJO GASTROESOFGICO (ERGE) CON ESOFAGITIS: La ERGE con esofagitis es una afeccin en la que el cido del estmago frecuentemente regresa al tubo que conecta VISITAR RESUMEN:  Durante la visita de hoy, revisamos su asma persistente grave, enfermedad por reflujo gastroesofgico (ERGE) con esofagitis y rinitis alrgica perenne. Inform un buen control de sus sntomas de asma con Trelegy y no necesita recientemente un inhalador de emergencia. Sus sntomas de [employer] se controlan bien con Protonix y Biomedical scientist, y actualmente no tiene sntomas de [place] laboratory technician o reflujo. Tambin discutimos la importancia de Scientist, physiological ERGE para prevenir la exacerbacin de los sntomas del asma. Mencion que planea recibir su vacuna contra la  influenza  en su cita de atencin primaria.  -RINITIS ALRGICA PERENNE: La rinitis alrgica perenne es una afeccin en la que tiene sntomas de [place] durante todo el ao, como secrecin o congestin nasal. No se discutieron cambios especficos en su plan de gestin hoy.  -MANTENIMIENTO GENERAL DE SALUD: An no has recibido tu vacuna contra la influenza esta temporada. Es importante vacunarse para protegerse de Emergency planning/management officer. Reciba la vacuna contra la influenza en su cita de atencin primaria.  INSTRUCCIONES:  Realice un seguimiento en seis meses para una revisin de su asma, ERGE y rinitis alrgica. Contine tomando sus medicamentos segn lo recetado y [place] su vacuna contra la influenza en su cita de atencin primaria.

## 2023-03-07 IMAGING — DX DG CHEST 2V
2 series · 2 of 2 positions shown · non-contrast
Comparison: 11/13/2020

CLINICAL DATA: 60-year-old male with chronic cough

EXAM:
CHEST - 2 VIEW

[chest pa]
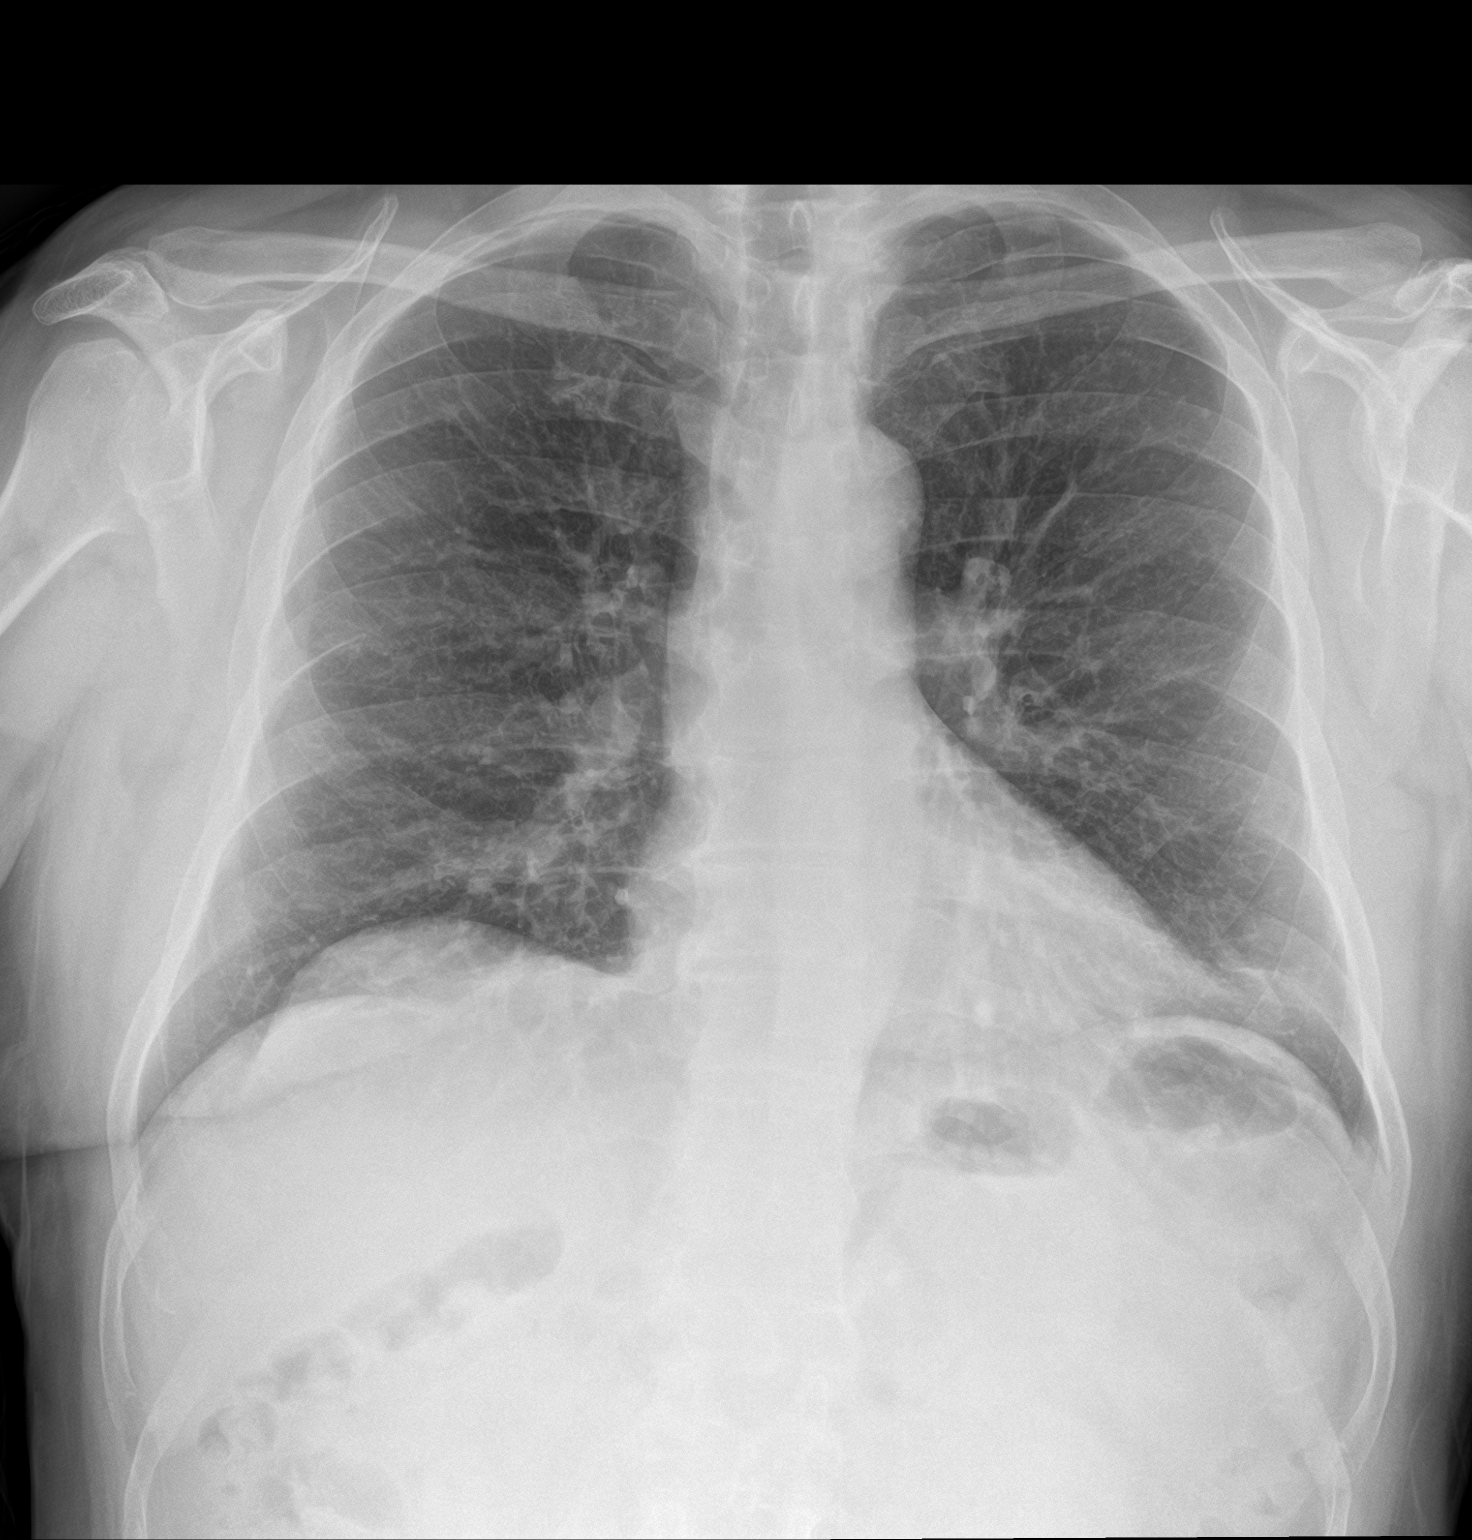

[chest lat]
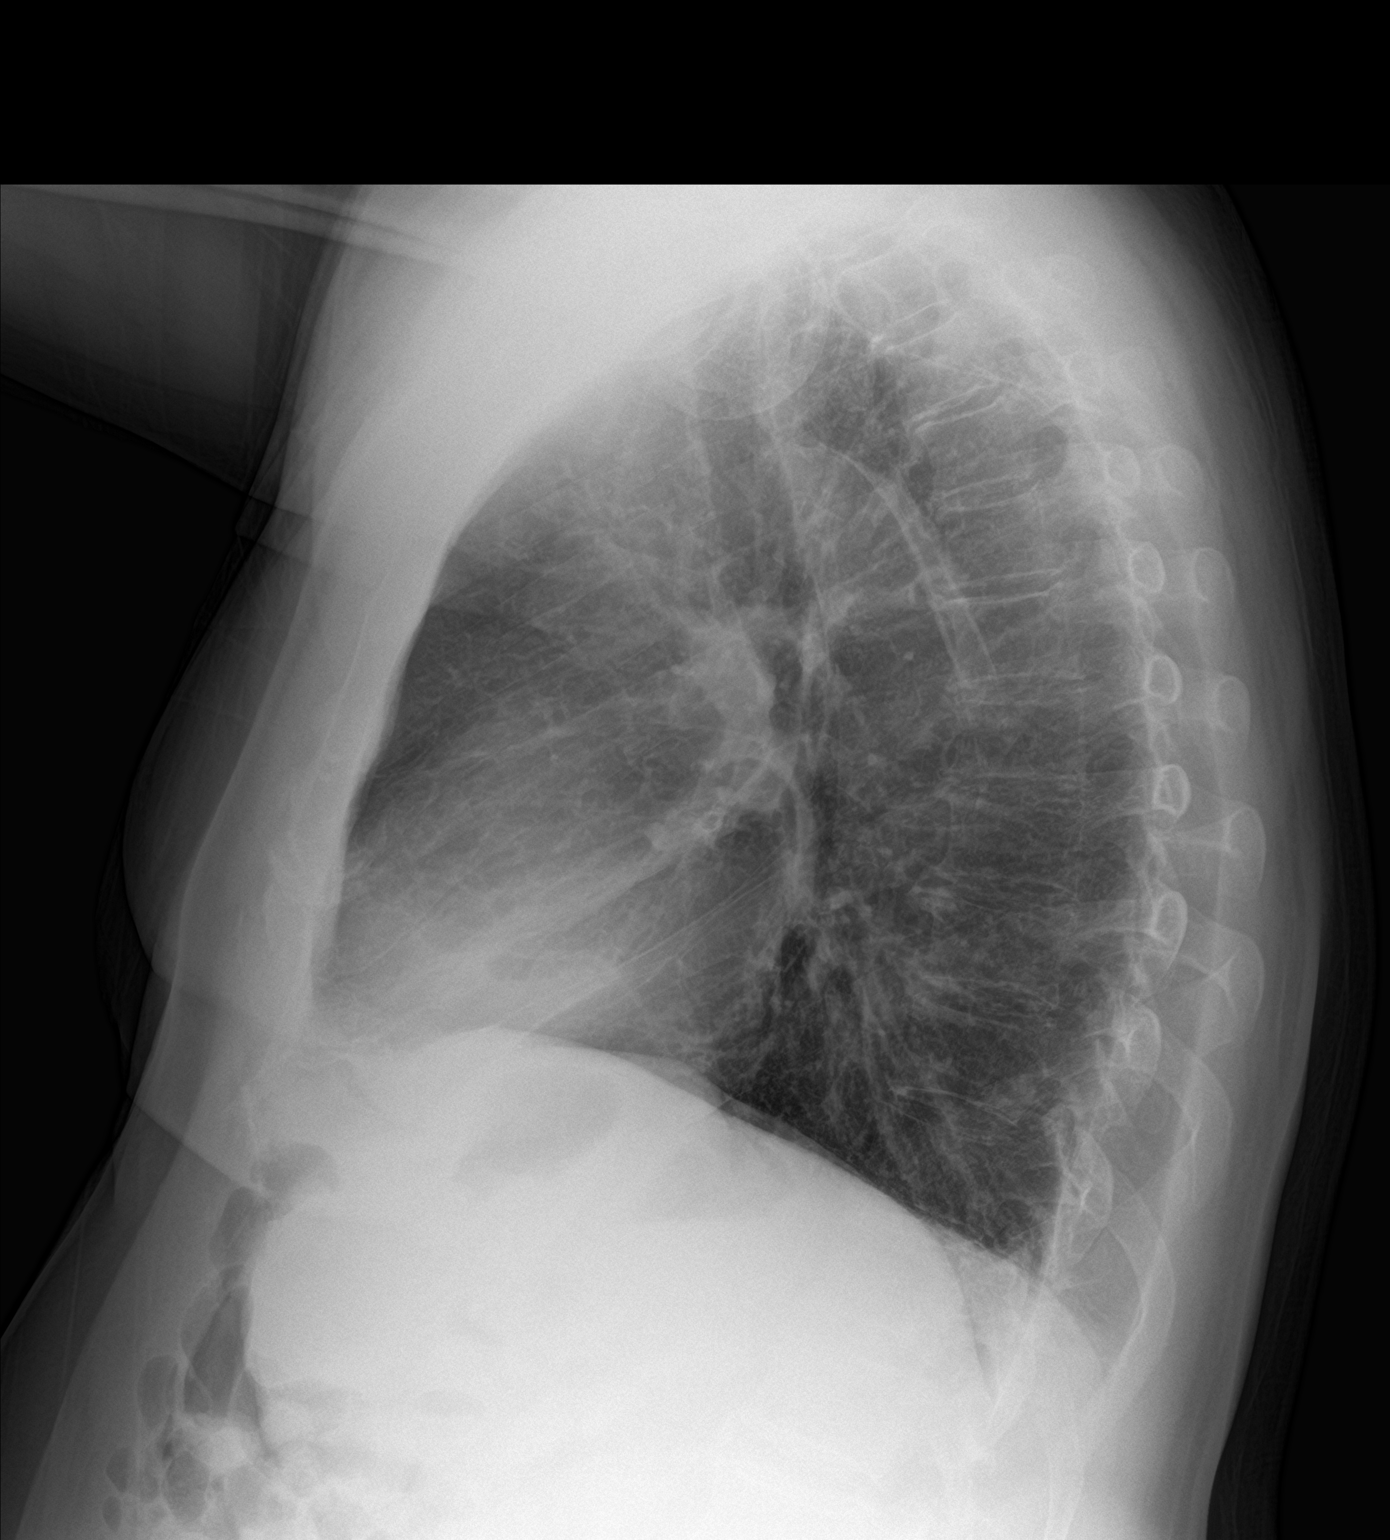

[2 of 2 positions shown; findings below may reference images not displayed]

FINDINGS: Cardiomediastinal silhouette unchanged in size and contour. No
evidence of central vascular congestion. No interlobular septal
thickening.

Similar appearance of coarsened interstitial markings with no new
confluent airspace disease.

No pneumothorax or pleural effusion.

No acute displaced fracture. Degenerative changes of the spine.
IMPRESSION: Negative for acute cardiopulmonary disease

## 2023-04-27 ENCOUNTER — Encounter: Payer: Self-pay | Admitting: Internal Medicine

## 2023-04-27 ENCOUNTER — Ambulatory Visit: Payer: 59 | Admitting: Internal Medicine

## 2023-04-27 VITALS — BP 138/82 | HR 74 | Ht 64.0 in | Wt 197.6 lb

## 2023-04-27 DIAGNOSIS — R739 Hyperglycemia, unspecified: Secondary | ICD-10-CM

## 2023-04-27 DIAGNOSIS — Z23 Encounter for immunization: Secondary | ICD-10-CM | POA: Diagnosis not present

## 2023-04-27 DIAGNOSIS — J4550 Severe persistent asthma, uncomplicated: Secondary | ICD-10-CM | POA: Diagnosis not present

## 2023-04-27 DIAGNOSIS — N401 Enlarged prostate with lower urinary tract symptoms: Secondary | ICD-10-CM

## 2023-04-27 DIAGNOSIS — R3912 Poor urinary stream: Secondary | ICD-10-CM

## 2023-04-27 DIAGNOSIS — E559 Vitamin D deficiency, unspecified: Secondary | ICD-10-CM

## 2023-04-27 DIAGNOSIS — E782 Mixed hyperlipidemia: Secondary | ICD-10-CM

## 2023-04-27 DIAGNOSIS — K219 Gastro-esophageal reflux disease without esophagitis: Secondary | ICD-10-CM | POA: Diagnosis not present

## 2023-04-27 DIAGNOSIS — I10 Essential (primary) hypertension: Secondary | ICD-10-CM | POA: Diagnosis not present

## 2023-04-27 MED ORDER — AMLODIPINE-OLMESARTAN 10-20 MG PO TABS: 1.0000 | ORAL_TABLET | Freq: Every day | ORAL | 1 refills | Status: DC

## 2023-04-27 MED ORDER — TAMSULOSIN HCL 0.4 MG PO CAPS: 0.4000 mg | ORAL_CAPSULE | Freq: Every day | ORAL | 3 refills | Status: AC

## 2023-04-27 NOTE — Assessment & Plan Note (Addendum)
BP Readings from Last 1 Encounters:  04/27/23 138/82   Well-controlled with Azor 10-20 mg QD Counseled for compliance with the medications Advised DASH diet and moderate exercise/walking, at least 150 mins/week

## 2023-04-27 NOTE — Assessment & Plan Note (Addendum)
Well-controlled with Trelegy Uses albuterol as needed for dyspnea or wheezing Has chronic cough, takes Mucinex as needed Followed by pulmonology

## 2023-04-27 NOTE — Assessment & Plan Note (Signed)
Well controlled usually Has intermittent bloating -can take PRN Pantoprazole

## 2023-04-27 NOTE — Patient Instructions (Signed)
Please continue to take medications as prescribed.  Please continue to follow DASH diet and perform moderate exercise/walking at least 150 mins/week.  Please get fasting blood tests done before the next visit.

## 2023-04-27 NOTE — Progress Notes (Signed)
Established Patient Office Visit  Subjective:  Patient ID: Nicholas Howard, male    DOB: 1961-01-03  Age: 63 y.o. MRN: [id number]  CC:  Chief Complaint  Patient presents with   Hypertension   Asthma    HPI Nicholas Howard is a 63 y.o. male with past medical history of HTN, asthma and BPH who presents for f/u of his chronic medical conditions.  HTN: He has been taking amlodipine-olmesartan 10-20 mg daily. His BP was slightly wnl today. He denies any headache, dizziness, chest pain or palpitations.  Asthma:  He has chronic cough, but better recently. Denies any dyspnea currently. He has been using Trelegy and albuterol inhaler as needed for chronic cough or wheezing.  He has been evaluated by pulmonology. Denies any recent fever, chills, nasal congestion or sinus pressure.  He reports worsening of his cough especially with outdoor working.   BPH: He takes Flomax QD now. He had weak urinary stream and nocturia when he did not take Flomax for few days. Denies any dysuria or hematuria currently.   Past Medical History:  Diagnosis Date   Encounter for screening for malignant neoplasm of prostate 03/19/2019   Hypertension    Pneumonia 08/2021   one time   Trapezius muscle spasm 03/19/2019    Past Surgical History:  Procedure Laterality Date   BACK SURGERY     COLONOSCOPY WITH PROPOFOL N/A 11/13/2019   Procedure: COLONOSCOPY WITH PROPOFOL;  Surgeon: Dolores Frame, MD;  [facility]: [facility];  Service: Gastroenterology;  Laterality: N/A;  10:00   COLONOSCOPY WITH PROPOFOL N/A 02/03/2023   Procedure: COLONOSCOPY WITH PROPOFOL;  Surgeon: Dolores Frame, MD;  Location: [facility];  Service: Gastroenterology;  Laterality: N/A;  1:15PM, ASA 1-2   KNEE SURGERY     POLYPECTOMY  11/13/2019   Procedure: POLYPECTOMY;  Surgeon: Dolores Frame, MD;  Location: [facility];  Service: Gastroenterology;;   POLYPECTOMY  02/03/2023   Procedure: POLYPECTOMY;   Surgeon: Marguerita Merles, Reuel Boom, MD;  Location: [facility];  Service: Gastroenterology;;    Family History  Problem Relation Age of Onset   Hypertension Mother     Social History   Socioeconomic History   Marital status: Married    Spouse name: Not on file   Number of children: 3   Years of education: Not on file   Highest education level: 4th grade  Occupational History    Comment: [employer]   Tobacco Use   Smoking status: Never   Smokeless tobacco: Never  Vaping Use   Vaping status: Never Used  Substance and Sexual Activity   Alcohol use: Not Currently   Drug use: Never   Sexual activity: Not Currently  Other Topics Concern   Not on file  Social History Narrative   Lives with wife and youngest daughter   2 dogs      Enjoys outdoors walking when weather is nice, tv       Diet: tries to avoid pork , overall eats all food groups: limited veggies and fruits     Caffeine: 1 cup of coffee daily, sugar free soda-usually caffeine free   Water: 1 bottle daily       Wears seat belt   Smoke detectors    Does not use phone while driving   Social Drivers of Corporate investment banker Strain: Not on file  Food Insecurity: Not on file  Transportation Needs: Not on file  Physical Activity: Not on file  Stress: Not on file  Social Connections: Not on file  Intimate Partner Violence: Not on file    Outpatient Medications Prior to Visit  Medication Sig Dispense Refill   albuterol (VENTOLIN HFA) 108 (90 Base) MCG/ACT inhaler Inhale 2 puffs into the lungs every 6 (six) hours as needed for wheezing or shortness of breath. 18 g 2   Cholecalciferol (VITAMIN D) 50 MCG (2000 UT) CAPS Take by mouth daily.     Fluticasone-Umeclidin-Vilant (TRELEGY ELLIPTA) 200-62.5-25 MCG/ACT AEPB Inhale 1 puff into the lungs daily. 28 each 11   pantoprazole (PROTONIX) 40 MG tablet Take 1 tablet (40 mg total) by mouth daily. Take 30-60 min before first meal of the day 30 tablet 2    amlodipine-olmesartan (AZOR) 10-20 MG tablet Take 1 tablet by mouth daily. 90 tablet 1   tamsulosin (FLOMAX) 0.4 MG CAPS capsule Take 1 capsule (0.4 mg total) by mouth daily. 90 capsule 3   No facility-administered medications prior to visit.    No Known Allergies  ROS Review of Systems  Constitutional:  Negative for chills and fever.  HENT:  Negative for congestion and sore throat.   Eyes:  Negative for pain and discharge.  Respiratory:  Positive for cough. Negative for shortness of breath.   Cardiovascular:  Negative for chest pain and palpitations.  Gastrointestinal:  Negative for diarrhea, nausea and vomiting.  Endocrine: Negative for polydipsia and polyuria.  Genitourinary:  Negative for dysuria and hematuria.  Musculoskeletal:  Negative for neck pain and neck stiffness.  Skin:  Negative for rash.  Neurological:  Negative for dizziness, weakness, numbness and headaches.  Psychiatric/Behavioral:  Negative for agitation and behavioral problems.       Objective:    Physical Exam Vitals reviewed.  Constitutional:      General: He is not in acute distress.    Appearance: He is obese. He is not diaphoretic.  HENT:     Head: Normocephalic and atraumatic.     Nose: Nose normal.     Mouth/Throat:     Mouth: Mucous membranes are moist.  Eyes:     General: No scleral icterus.    Extraocular Movements: Extraocular movements intact.  Cardiovascular:     Rate and Rhythm: Normal rate and regular rhythm.     Pulses: Normal pulses.     Heart sounds: Normal heart sounds. No murmur heard. Pulmonary:     Breath sounds: Normal breath sounds. No wheezing or rales.  Musculoskeletal:     Cervical back: Neck supple. No tenderness.     Right lower leg: No edema.     Left lower leg: No edema.  Skin:    General: Skin is warm.     Findings: No rash.  Neurological:     General: No focal deficit present.     Mental Status: He is alert and oriented to person, place, and time.      Sensory: No sensory deficit.     Motor: No weakness.  Psychiatric:        Mood and Affect: Mood normal.        Behavior: Behavior normal.     BP 138/82 (BP Location: Right Arm)   Pulse 74   Ht 5\' 4"  (1.626 m)   Wt 197 lb 9.6 oz (89.6 kg)   SpO2 97%   BMI 33.92 kg/m  Wt Readings from Last 3 Encounters:  04/27/23 197 lb 9.6 oz (89.6 kg)  02/16/23 198 lb 6.4 oz (90 kg)  02/03/23 194 lb (88  kg)    Lab Results  Component Value Date   TSH 3.730 10/20/2022   Lab Results  Component Value Date   WBC 6.0 10/20/2022   HGB 15.1 10/20/2022   HCT 44.7 10/20/2022   MCV 91 10/20/2022   PLT 188 10/20/2022   Lab Results  Component Value Date   NA 141 10/20/2022   K 4.0 10/20/2022   CO2 20 10/20/2022   GLUCOSE 82 10/20/2022   BUN 19 10/20/2022   CREATININE 0.72 (L) 10/20/2022   BILITOT 1.2 10/20/2022   ALKPHOS 43 (L) 10/20/2022   AST 20 10/20/2022   ALT 18 10/20/2022   PROT 6.6 10/20/2022   ALBUMIN 4.2 10/20/2022   CALCIUM 9.1 10/20/2022   EGFR 104 10/20/2022   Lab Results  Component Value Date   CHOL 166 10/20/2022   Lab Results  Component Value Date   HDL 49 10/20/2022   Lab Results  Component Value Date   LDLCALC 101 (H) 10/20/2022   Lab Results  Component Value Date   TRIG 85 10/20/2022   Lab Results  Component Value Date   CHOLHDL 3.4 10/20/2022   Lab Results  Component Value Date   HGBA1C 5.5 10/20/2022      Assessment & Plan:   Problem List Items Addressed This Visit       Cardiovascular and Mediastinum   Essential hypertension - Primary   BP Readings from Last 1 Encounters:  04/27/23 138/82   Well-controlled with Azor 10-20 mg QD Counseled for compliance with the medications Advised DASH diet and moderate exercise/walking, at least 150 mins/week      Relevant Medications   amlodipine-olmesartan (AZOR) 10-20 MG tablet   Other Relevant Orders   TSH   CMP14+EGFR   CBC with Differential/Platelet     Respiratory   Severe persistent  asthma   Well-controlled with Trelegy Uses albuterol as needed for dyspnea or wheezing Has chronic cough, takes Mucinex as needed Followed by pulmonology      Relevant Orders   CBC with Differential/Platelet     Digestive   GERD (gastroesophageal reflux disease)   Well controlled usually Has intermittent bloating -can take PRN Pantoprazole        Genitourinary   Benign prostatic hyperplasia with weak urinary stream   Refilled Flomax, needs to take it regularly      Relevant Medications   tamsulosin (FLOMAX) 0.4 MG CAPS capsule   Other Relevant Orders   PSA     Other   Hyperlipemia   Advised to follow DASH diet Check lipid profile      Relevant Medications   amlodipine-olmesartan (AZOR) 10-20 MG tablet   Other Relevant Orders   Lipid panel   Vitamin D deficiency    Last vitamin D Lab Results  Component Value Date   VD25OH 23.7 (L) 10/20/2022   Needs to take vitamin D 2000 IU QD      Relevant Orders   VITAMIN D 25 Hydroxy (Vit-D Deficiency, Fractures)   Other Visit Diagnoses       Hyperglycemia       Relevant Orders   Hemoglobin A1c   CMP14+EGFR     Encounter for immunization       Relevant Orders   Flu vaccine trivalent PF, 6mos and older(Flulaval,Afluria,Fluarix,Fluzone) (Completed)   Pneumococcal conjugate vaccine 20-valent (Completed)        Meds ordered this encounter  Medications   tamsulosin (FLOMAX) 0.4 MG CAPS capsule    Sig: Take 1 capsule (0.4 mg  total) by mouth daily.    Dispense:  90 capsule    Refill:  3   amlodipine-olmesartan (AZOR) 10-20 MG tablet    Sig: Take 1 tablet by mouth daily.    Dispense:  90 tablet    Refill:  1    Follow-up: Return in about 6 months (around 10/25/2023) for Annual physical (after 10/24/23).    Anabel Halon, MD

## 2023-04-27 NOTE — Assessment & Plan Note (Signed)
Refilled Flomax, needs to take it regularly ?

## 2023-04-27 NOTE — Assessment & Plan Note (Signed)
Last vitamin D Lab Results  Component Value Date   VD25OH 23.7 (L) 10/20/2022   Needs to take vitamin D 2000 IU QD

## 2023-04-27 NOTE — Assessment & Plan Note (Signed)
Advised to follow DASH diet ?Check lipid profile ?

## 2023-07-08 IMAGING — CT CT CHEST W/O CM
2 of 4 series · 15 of 36 positions shown, 18 images · non-contrast
Comparison: Chest x-ray April 23, 2021

CLINICAL DATA: Chronic cough for 8 weeks.



[Series 2: routine chest without · axial · non-contrast · 0.75mm/px · z∈[+1190,+1446]mm · 12 of 152 slices shown, 15 images]
[im 12/152  mediastinal]
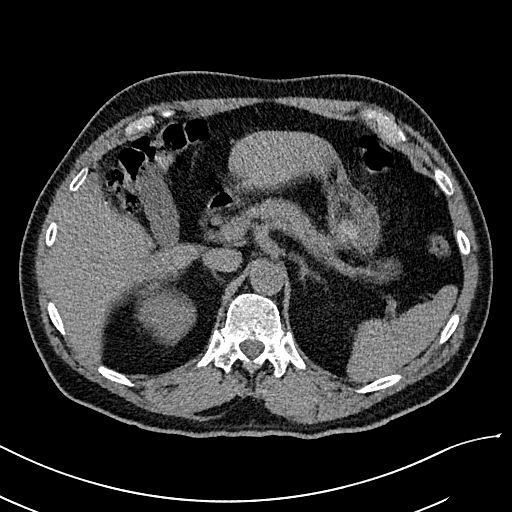
[im 12/152  lung]
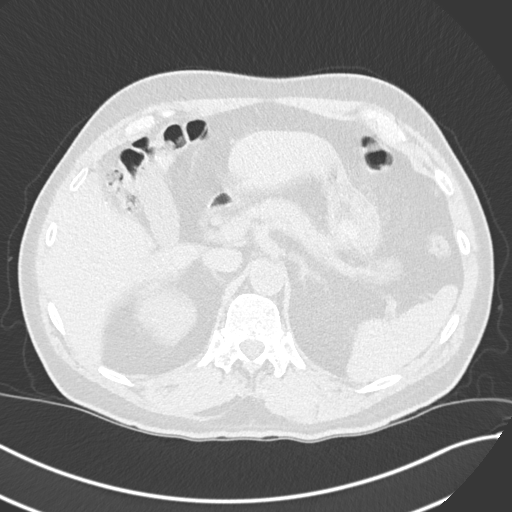
[im 24/152  lung]
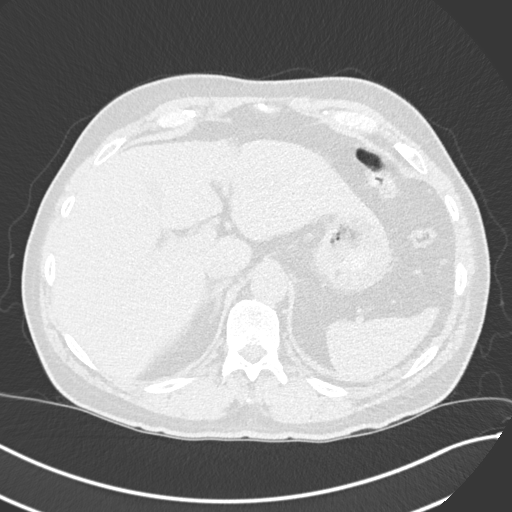
[im 35/152  lung]
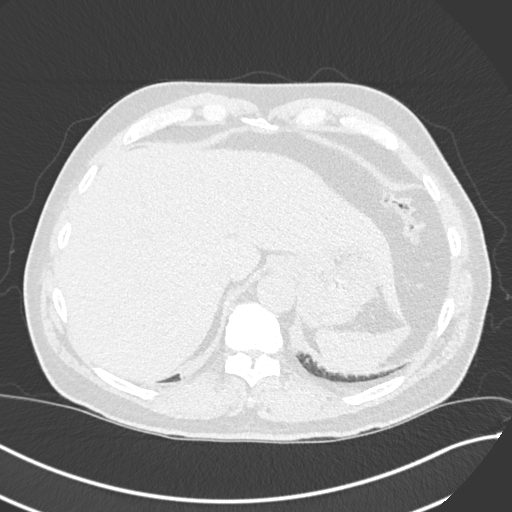
[im 47/152  lung]
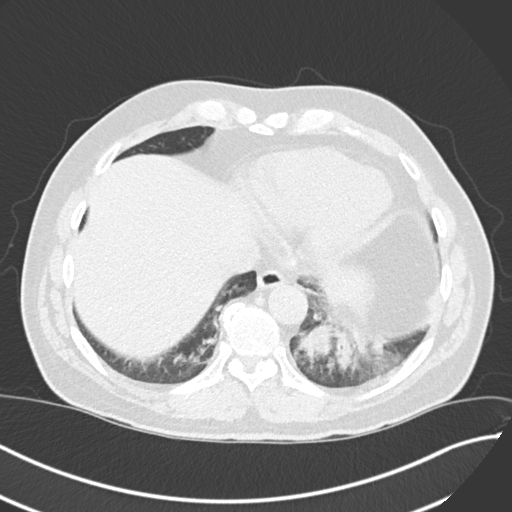
[im 59/152  mediastinal]
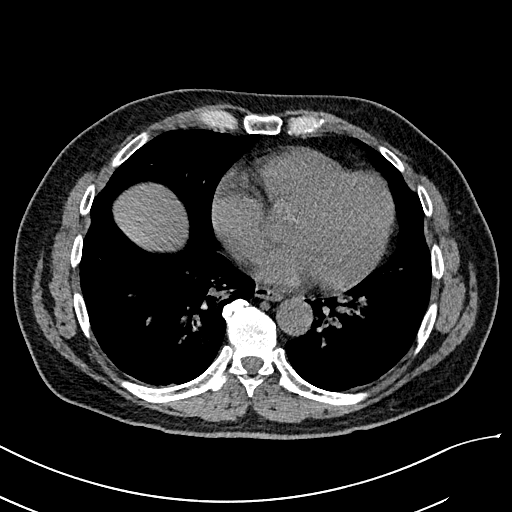
[im 59/152  lung]
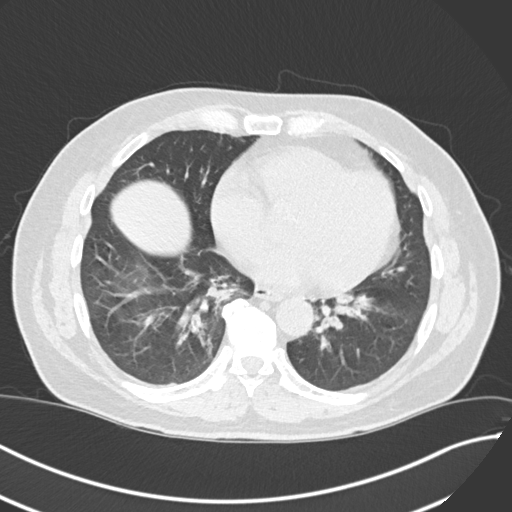
[im 70/152  lung]
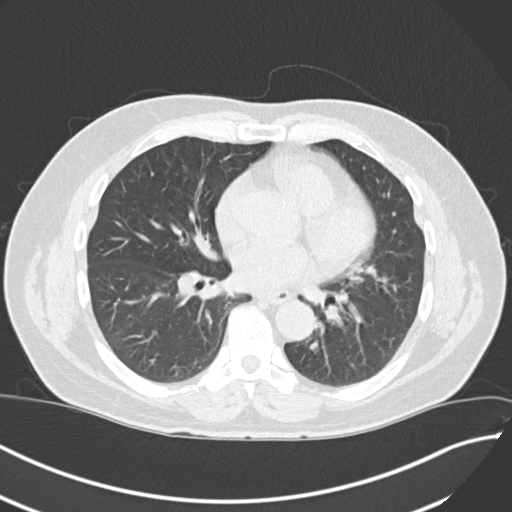
[im 82/152  lung]
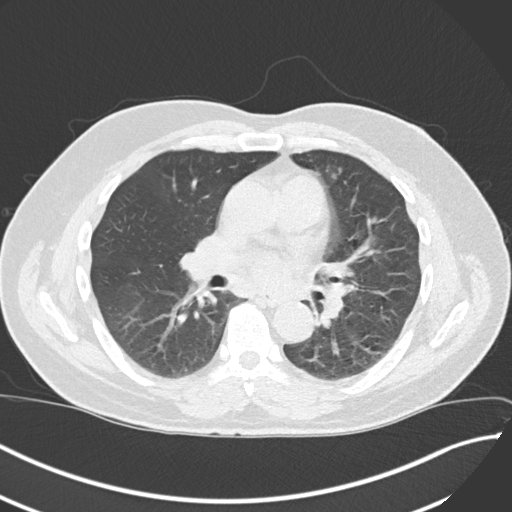
[im 93/152  lung]
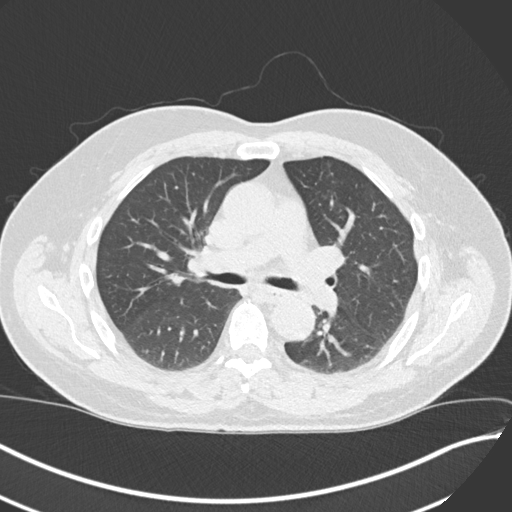
[im 105/152  mediastinal]
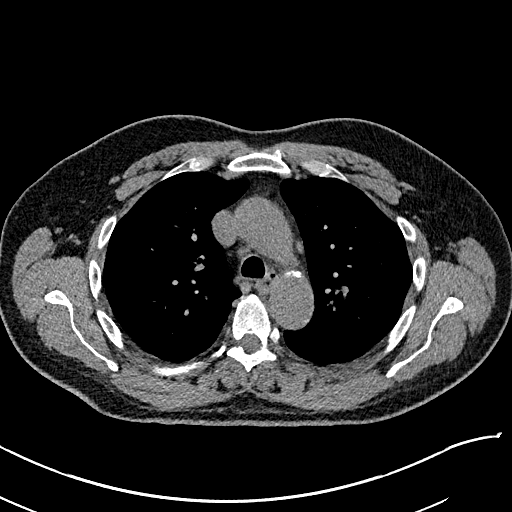
[im 105/152  lung]
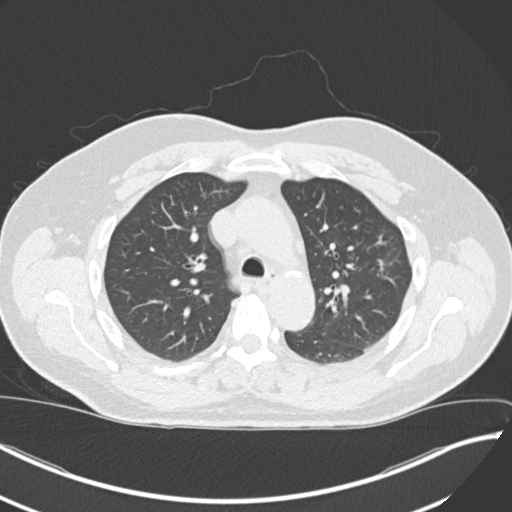
[im 117/152  lung]
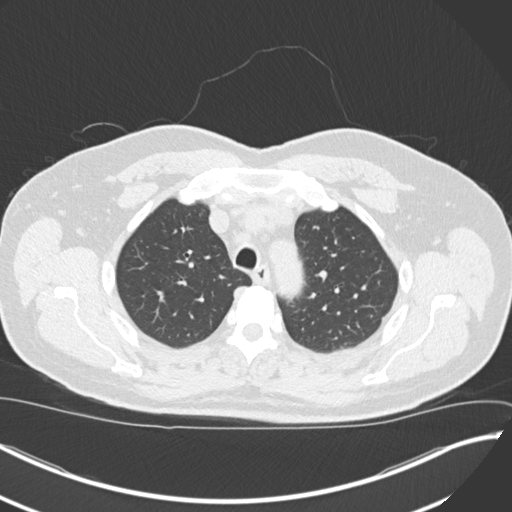
[im 128/152  lung]
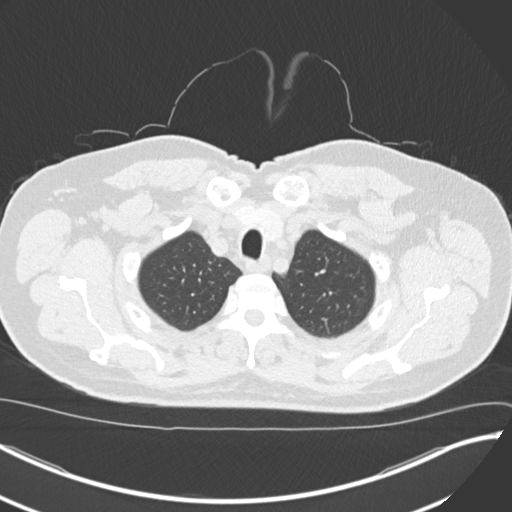
[im 140/152  lung]
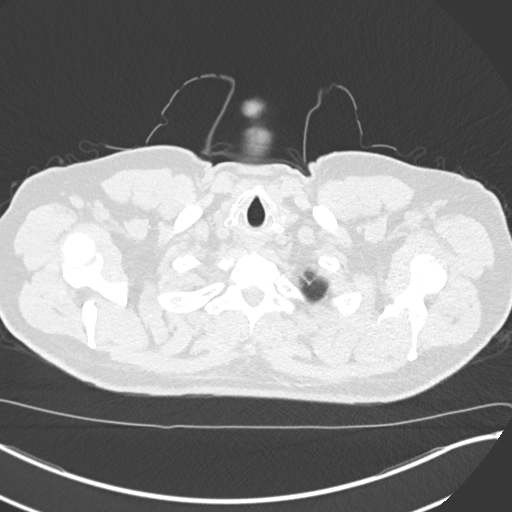

[Series 5: coronal · coronal · 0.68mm/px · 3 of 151 slices shown]
[im 31/151  lung]
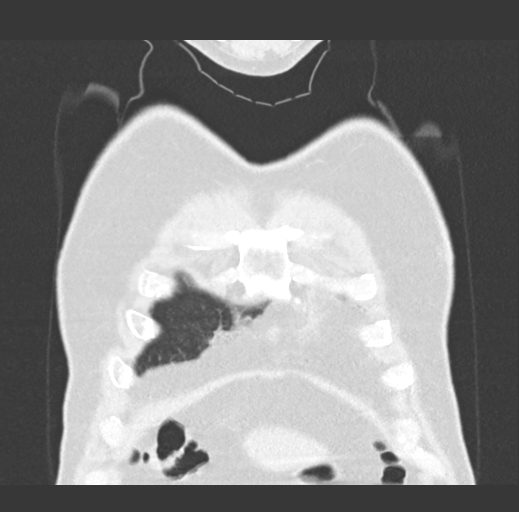
[im 61/151  lung]
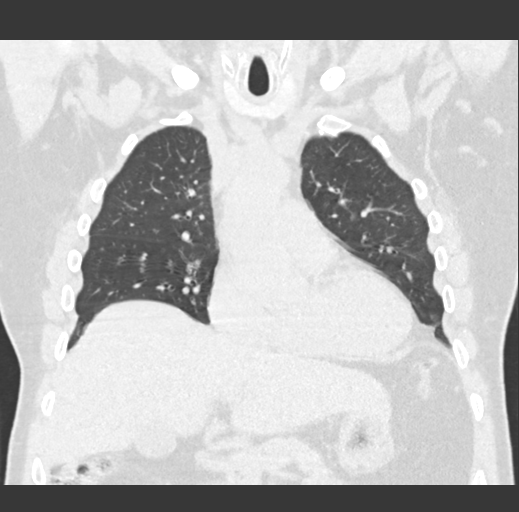
[im 91/151  lung]
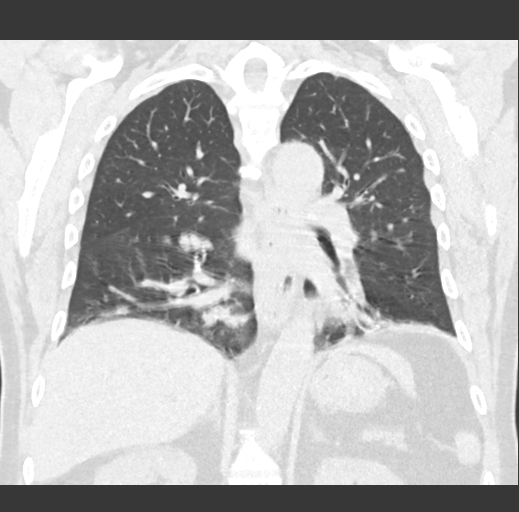

[15 of 36 positions shown; findings below may reference images not displayed]

FINDINGS: Cardiovascular: No significant vascular findings. Normal heart size.
No pericardial effusion.

Mediastinum/Nodes: Mild bilateral hilar and mediastinal
lymphadenopathy are identified, largest lymph node measures 1.1 cm
in short axis in precarinal region. The trachea and esophagus are
unremarkable.

Lungs/Pleura: There are patchy consolidations of bilateral lower
lobes, left greater than right. Minimal patchy consolidation of the
left upper lobe is noted.

Upper Abdomen: No acute abnormality.

Musculoskeletal: Mild degenerative joint changes of the spine are
noted.
IMPRESSION: 1. Patchy consolidations of bilateral lower lobes, left greater than
right. Minimal patchy consolidation of the left upper lobe is noted.
Findings are consistent with multifocal pneumonia.
2. Mild bilateral hilar and mediastinal lymphadenopathy, likely
reactive.

## 2023-08-17 ENCOUNTER — Encounter: Payer: Self-pay | Admitting: Pulmonary Disease

## 2023-08-17 ENCOUNTER — Ambulatory Visit (INDEPENDENT_AMBULATORY_CARE_PROVIDER_SITE_OTHER): Payer: 59 | Admitting: Pulmonary Disease

## 2023-08-17 VITALS — BP 122/80 | HR 69 | Temp 97.1°F | Ht 64.0 in | Wt 198.8 lb

## 2023-08-17 DIAGNOSIS — J4550 Severe persistent asthma, uncomplicated: Secondary | ICD-10-CM | POA: Diagnosis not present

## 2023-08-17 NOTE — Progress Notes (Unsigned)
 Subjective:    Patient ID: Nicholas Howard, male    DOB: Jan 29, 1961, 63 y.o.   MRN: [id number]  Patient Care Team: Meldon Sport, MD as PCP - General (Internal Medicine) Marc Senior, MD as Consulting Physician (Pulmonary Disease)  Chief Complaint  Patient presents with  . Follow-up    No SOB, wheezing or cough.    BACKGROUND/INTERVAL:  HPI    Review of Systems A 10 point review of systems was performed and it is as noted above otherwise negative.   Patient Active Problem List   Diagnosis Date Noted  . History of colonic polyps 02/03/2023  . Acute bacterial conjunctivitis of right eye 10/24/2022  . Chronic cough 11/25/2021  . Encounter for general adult medical examination with abnormal findings 08/20/2021  . Severe persistent asthma 07/28/2021  . Benign prostatic hyperplasia with weak urinary stream 01/15/2021  . Vitamin D  deficiency 01/15/2021  . GERD (gastroesophageal reflux disease) 11/13/2020  . Chronic left-sided low back pain without sciatica 11/13/2020  . Encounter for screening for malignant neoplasm of prostate 08/16/2019  . Hyperlipemia 04/19/2019  . Essential hypertension 03/19/2019  . Obesity (BMI 30.0-34.9) 03/19/2019    Social History   Tobacco Use  . Smoking status: Never  . Smokeless tobacco: Never  Substance Use Topics  . Alcohol use: Not Currently    No Known Allergies  Current Meds  Medication Sig  . albuterol  (VENTOLIN  HFA) 108 (90 Base) MCG/ACT inhaler Inhale 2 puffs into the lungs every 6 (six) hours as needed for wheezing or shortness of breath.  . amlodipine -olmesartan  (AZOR ) 10-20 MG tablet Take 1 tablet by mouth daily.  . Cholecalciferol (VITAMIN D ) 50 MCG (2000 UT) CAPS Take by mouth daily.  . Fluticasone -Umeclidin-Vilant (TRELEGY ELLIPTA ) 200-62.5-25 MCG/ACT AEPB Inhale 1 puff into the lungs daily.  . pantoprazole  (PROTONIX ) 40 MG tablet Take 1 tablet (40 mg total) by mouth daily. Take 30-60 min before first meal of the  day  . tamsulosin  (FLOMAX ) 0.4 MG CAPS capsule Take 1 capsule (0.4 mg total) by mouth daily.    Immunization History  Administered Date(s) Administered  . Influenza, Seasonal, Injecte, Preservative Fre 04/27/2023  . Influenza,inj,Quad PF,6+ Mos 03/19/2019, 02/14/2020, 01/15/2021, 12/08/2021  . PFIZER(Purple Top)SARS-COV-2 Vaccination 06/08/2019, 07/02/2019, 02/23/2020  . PNEUMOCOCCAL CONJUGATE-20 04/27/2023  . Pneumococcal Conjugate-13 08/16/2019  . Tdap 08/26/2013  . Zoster Recombinant(Shingrix) 08/14/2020, 11/13/2020        Objective:     BP 122/80 (BP Location: Right Arm, Cuff Size: Normal)   Pulse 69   Temp (!) 97.1 F (36.2 C)   Ht 5\' 4"  (1.626 m)   Wt 198 lb 12.8 oz (90.2 kg)   SpO2 97%   BMI 34.12 kg/m   SpO2: 97 % O2 Device: None (Room air)  GENERAL: HEAD: Normocephalic, atraumatic.  EYES: Pupils equal, round, reactive to light.  No scleral icterus.  MOUTH:  NECK: Supple. No thyromegaly. Trachea midline. No JVD.  No adenopathy. PULMONARY: Good air entry bilaterally.  No adventitious sounds. CARDIOVASCULAR: S1 and S2. Regular rate and rhythm.  ABDOMEN: MUSCULOSKELETAL: No joint deformity, no clubbing, no edema.  NEUROLOGIC:  SKIN: Intact,warm,dry. PSYCH:        Assessment & Plan:     ICD-10-CM   1. Severe persistent asthma without complication  J45.50 Nitric oxide       Orders Placed This Encounter  Procedures  . Nitric oxide     No orders of the defined types were placed in this encounter.  Advised if symptoms do not improve or worsen, to please contact office for sooner follow up or seek emergency care.    I spent xxx minutes of dedicated to the care of this patient on the date of this encounter to include pre-visit review of records, face-to-face time with the patient discussing conditions above, post visit ordering of testing, clinical documentation with the electronic health record, making appropriate referrals as documented, and  communicating necessary findings to members of the patients care team.     C. Chloe Counter, MD Advanced Bronchoscopy PCCM [facility]    *This note was generated using voice recognition software/Dragon and/or AI transcription program.  Despite best efforts to proofread, errors can occur which can change the meaning. Any transcriptional errors that result from this process are unintentional and may not be fully corrected at the time of dictation.

## 2023-08-17 NOTE — Patient Instructions (Addendum)
 VISIT SUMMARY:  Today, you came in for a follow-up appointment to check on your asthma. You mentioned that your symptoms are well-controlled, and you are using your inhaler less frequently. You have not experienced any recent issues with cough, respiratory distress, wheezing, or chest tightness.  YOUR PLAN:  -ASTHMA: Asthma is a condition where your airways become inflamed and narrow, making it hard to breathe. Your asthma is well-controlled with your current inhaler regimen. Continue using your inhaler (Trelegy) once daily at night and as needed during the day. If you experience any respiratory issues like wheezing or chest tightness, report them immediately.  INSTRUCTIONS:  Please schedule a follow-up appointment in six months.  Call sooner should any new problems arise.   RESUMEN DE LA VISITA:  Hoy acudi a una cita de seguimiento para revisar su asma. Mencion que sus sntomas estn bien controlados y que est usando su inhalador con menos frecuencia. No ha experimentado problemas recientes de tos, dificultad respiratoria, sibilancias ni opresin en el pecho.  SU PLAN:  - ASMA: El asma es una afeccin en la que las vas respiratorias se inflaman y Engineer, technical sales, lo que dificulta la respiracin. Su asma est bien controlada con su rgimen actual de inhaladores. Contine usando Education administrator (Trelegy) Gomez Lathe al da por la noche y segn sea necesario durante Medical laboratory scientific officer. Si experimenta algn problema respiratorio como sibilancias u opresin en el pecho, infrmelo de inmediato.  INSTRUCCIONES:  Programe una cita de seguimiento dentro de seis meses. Llame antes si surge algn problema nuevo.

## 2023-10-24 ENCOUNTER — Other Ambulatory Visit: Payer: Self-pay | Admitting: Internal Medicine

## 2023-10-24 DIAGNOSIS — R7989 Other specified abnormal findings of blood chemistry: Secondary | ICD-10-CM

## 2023-10-24 LAB — CMP14+EGFR
ALT: 28 IU/L (ref 0–44)
AST: 19 IU/L (ref 0–40)
Albumin: 4.4 g/dL (ref 3.9–4.9)
Alkaline Phosphatase: 40 IU/L — ABNORMAL LOW (ref 44–121)
BUN/Creatinine Ratio: 28 — ABNORMAL HIGH (ref 10–24)
BUN: 21 mg/dL (ref 8–27)
Bilirubin Total: 0.8 mg/dL (ref 0.0–1.2)
CO2: 19 mmol/L — ABNORMAL LOW (ref 20–29)
Calcium: 9.3 mg/dL (ref 8.6–10.2)
Chloride: 105 mmol/L (ref 96–106)
Creatinine, Ser: 0.74 mg/dL — ABNORMAL LOW (ref 0.76–1.27)
Globulin, Total: 2.4 g/dL (ref 1.5–4.5)
Glucose: 89 mg/dL (ref 70–99)
Potassium: 4.4 mmol/L (ref 3.5–5.2)
Sodium: 139 mmol/L (ref 134–144)
Total Protein: 6.8 g/dL (ref 6.0–8.5)
eGFR: 102 mL/min/1.73 (ref >59)

## 2023-10-24 LAB — LIPID PANEL
Chol/HDL Ratio: 3.2 ratio (ref 0.0–5.0)
Cholesterol, Total: 168 mg/dL (ref 100–199)
HDL: 52 mg/dL (ref >39)
LDL Chol Calc (NIH): 101 mg/dL — ABNORMAL HIGH (ref 0–99)
Triglycerides: 79 mg/dL (ref 0–149)
VLDL Cholesterol Cal: 15 mg/dL (ref 5–40)

## 2023-10-24 LAB — CBC WITH DIFFERENTIAL/PLATELET
Basophils Absolute: 0.1 x10E3/uL (ref 0.0–0.2)
Basos: 1 %
EOS (ABSOLUTE): 0.5 x10E3/uL — ABNORMAL HIGH (ref 0.0–0.4)
Eos: 8 %
Hematocrit: 50.1 % (ref 37.5–51.0)
Hemoglobin: 16.3 g/dL (ref 13.0–17.7)
Immature Grans (Abs): 0 x10E3/uL (ref 0.0–0.1)
Immature Granulocytes: 0 %
Lymphocytes Absolute: 2.9 x10E3/uL (ref 0.7–3.1)
Lymphs: 46 %
MCH: 30.9 pg (ref 26.6–33.0)
MCHC: 32.5 g/dL (ref 31.5–35.7)
MCV: 95 fL (ref 79–97)
Monocytes Absolute: 0.5 x10E3/uL (ref 0.1–0.9)
Monocytes: 7 %
Neutrophils Absolute: 2.5 x10E3/uL (ref 1.4–7.0)
Neutrophils: 38 %
Platelets: 186 x10E3/uL (ref 150–450)
RBC: 5.28 x10E6/uL (ref 4.14–5.80)
RDW: 13.1 % (ref 11.6–15.4)
WBC: 6.5 x10E3/uL (ref 3.4–10.8)

## 2023-10-24 LAB — HEMOGLOBIN A1C
Est. average glucose Bld gHb Est-mCnc: 103 mg/dL
Hgb A1c MFr Bld: 5.2 % (ref 4.8–5.6)

## 2023-10-24 LAB — TSH: TSH: 6.7 u[IU]/mL — ABNORMAL HIGH (ref 0.450–4.500)

## 2023-10-24 LAB — VITAMIN D 25 HYDROXY (VIT D DEFICIENCY, FRACTURES): Vit D, 25-Hydroxy: 24.2 ng/mL — ABNORMAL LOW (ref 30.0–100.0)

## 2023-10-24 LAB — PSA: Prostate Specific Ag, Serum: 1.6 ng/mL (ref 0.0–4.0)

## 2023-10-25 ENCOUNTER — Ambulatory Visit (INDEPENDENT_AMBULATORY_CARE_PROVIDER_SITE_OTHER): Payer: 59 | Admitting: Internal Medicine

## 2023-10-25 ENCOUNTER — Encounter: Payer: Self-pay | Admitting: Internal Medicine

## 2023-10-25 VITALS — BP 132/82 | HR 65 | Ht 64.0 in | Wt 200.4 lb

## 2023-10-25 DIAGNOSIS — H6123 Impacted cerumen, bilateral: Secondary | ICD-10-CM | POA: Diagnosis not present

## 2023-10-25 DIAGNOSIS — K219 Gastro-esophageal reflux disease without esophagitis: Secondary | ICD-10-CM

## 2023-10-25 DIAGNOSIS — J4550 Severe persistent asthma, uncomplicated: Secondary | ICD-10-CM | POA: Diagnosis not present

## 2023-10-25 DIAGNOSIS — I10 Essential (primary) hypertension: Secondary | ICD-10-CM | POA: Diagnosis not present

## 2023-10-25 DIAGNOSIS — R3912 Poor urinary stream: Secondary | ICD-10-CM

## 2023-10-25 DIAGNOSIS — Z0001 Encounter for general adult medical examination with abnormal findings: Secondary | ICD-10-CM | POA: Diagnosis not present

## 2023-10-25 DIAGNOSIS — N401 Enlarged prostate with lower urinary tract symptoms: Secondary | ICD-10-CM

## 2023-10-25 DIAGNOSIS — R7989 Other specified abnormal findings of blood chemistry: Secondary | ICD-10-CM | POA: Insufficient documentation

## 2023-10-25 DIAGNOSIS — E559 Vitamin D deficiency, unspecified: Secondary | ICD-10-CM

## 2023-10-25 DIAGNOSIS — M170 Bilateral primary osteoarthritis of knee: Secondary | ICD-10-CM | POA: Insufficient documentation

## 2023-10-25 DIAGNOSIS — Z23 Encounter for immunization: Secondary | ICD-10-CM | POA: Diagnosis not present

## 2023-10-25 NOTE — Assessment & Plan Note (Signed)
 Annual exam as documented. Counseling done  re healthy lifestyle involving commitment to 150 minutes exercise per week, heart healthy diet, and attaining healthy weight.The importance of adequate sleep also discussed.  Immunization and cancer screening needs are specifically addressed at this visit.

## 2023-10-25 NOTE — Assessment & Plan Note (Signed)
  Last vitamin D  Lab Results  Component Value Date   VD25OH 24.2 (L) 10/23/2023   Needs to take vitamin D  2000 IU QD

## 2023-10-25 NOTE — Assessment & Plan Note (Signed)
 Bilateral knee pain, followed by [facility] in [place] - has been told to get TKA, but he is still thinking about it Advised to take Tylenol arthritis as needed for knee pain He has been using OTC pain relieving cream with mild relief

## 2023-10-25 NOTE — Assessment & Plan Note (Signed)
 Well-controlled with Trelegy Uses albuterol  as needed for dyspnea or wheezing Has chronic cough, takes Mucinex  as needed Followed by pulmonology

## 2023-10-25 NOTE — Assessment & Plan Note (Signed)
 On Flomax , needs to take it regularly

## 2023-10-25 NOTE — Patient Instructions (Signed)
 Please start taking Vitamin D  2000 IU once daily.  Please continue to take medications as prescribed.  Please continue to follow low carb diet and perform moderate exercise/walking at least 150 mins/week.  Please get fasting blood tests done before the next visit.

## 2023-10-25 NOTE — Progress Notes (Signed)
 Established Patient Office Visit  Subjective:  Patient ID: Nicholas Howard, male    DOB: 30-Apr-1960  Age: 63 y.o. MRN: [id number]  CC:  Chief Complaint  Patient presents with   Annual Exam    Annual exam, 6 month f/u    Ear Fullness    Pt reports sx of ear noise ongoing for 3 months.     HPI Nicholas Howard is a 63 y.o. male with past medical history of HTN, asthma and BPH who presents for annual physical.  HTN: He has been taking amlodipine -olmesartan  10-20 mg daily. His BP was slightly wnl today. He denies any headache, dizziness, chest pain or palpitations.  Asthma:  He has chronic cough, but better recently. Denies any dyspnea currently. He has been using Trelegy and albuterol  inhaler as needed for chronic cough or wheezing.  He has been evaluated by pulmonology. Denies any recent fever, chills.  He reports worsening of his cough especially with outdoor working.   BPH: He takes Flomax  QD now. He had weak urinary stream and nocturia when he did not take Flomax  for few days. Denies any dysuria or hematuria currently.  He reports bilateral tinnitus for the last 3 months.  Has mild hearing difficulty, but denies dizziness, ear pain or discharge currently.  He has chronic, intermittent nasal congestion.   Past Medical History:  Diagnosis Date   Encounter for screening for malignant neoplasm of prostate 03/19/2019   Hypertension    Pneumonia 08/2021   one time   Trapezius muscle spasm 03/19/2019    Past Surgical History:  Procedure Laterality Date   BACK SURGERY     COLONOSCOPY WITH PROPOFOL  N/A 11/13/2019   Procedure: COLONOSCOPY WITH PROPOFOL ;  Surgeon: Eartha Angelia Sieving, MD;  [facility];  Service: Gastroenterology;  Laterality: N/A;  10:00   COLONOSCOPY WITH PROPOFOL  N/A 02/03/2023   Procedure: COLONOSCOPY WITH PROPOFOL ;  Surgeon: Eartha Angelia Sieving, MD;  Location: [facility];  Service: Gastroenterology;  Laterality: N/A;  1:15PM, ASA 1-2    KNEE SURGERY     POLYPECTOMY  11/13/2019   Procedure: POLYPECTOMY;  Surgeon: Eartha Angelia Sieving, MD;  [facility]: [facility];  Service: Gastroenterology;;   POLYPECTOMY  02/03/2023   Procedure: POLYPECTOMY;  Surgeon: Eartha Angelia, Sieving, MD;  Location: [facility];  Service: Gastroenterology;;    Family History  Problem Relation Age of Onset   Hypertension Mother     Social History   Socioeconomic History   Marital status: Married    Spouse name: Not on file   Number of children: 3   Years of education: Not on file   Highest education level: 4th grade  Occupational History    Comment: [employer]   Tobacco Use   Smoking status: Never   Smokeless tobacco: Never  Vaping Use   Vaping status: Never Used  Substance and Sexual Activity   Alcohol use: Not Currently   Drug use: Never   Sexual activity: Not Currently  Other Topics Concern   Not on file  Social History Narrative   Lives with wife and youngest daughter   2 dogs      Enjoys outdoors walking when weather is nice, tv       Diet: tries to avoid pork , overall eats all food groups: limited veggies and fruits     Caffeine: 1 cup of coffee daily, sugar free soda-usually caffeine free   Water: 1 bottle daily       Wears seat belt  Smoke detectors    Does not use phone while driving   Social Drivers of Corporate investment banker Strain: Not on file  Food Insecurity: Not on file  Transportation Needs: Not on file  Physical Activity: Not on file  Stress: Not on file  Social Connections: Not on file  Intimate Partner Violence: Not on file    Outpatient Medications Prior to Visit  Medication Sig Dispense Refill   albuterol  (VENTOLIN  HFA) 108 (90 Base) MCG/ACT inhaler Inhale 2 puffs into the lungs every 6 (six) hours as needed for wheezing or shortness of breath. 18 g 2   amlodipine -olmesartan  (AZOR ) 10-20 MG tablet Take 1 tablet by mouth daily. 90 tablet 1   Cholecalciferol (VITAMIN D ) 50 MCG  (2000 UT) CAPS Take by mouth daily.     Fluticasone -Umeclidin-Vilant (TRELEGY ELLIPTA ) 200-62.5-25 MCG/ACT AEPB Inhale 1 puff into the lungs daily. 28 each 11   pantoprazole  (PROTONIX ) 40 MG tablet Take 1 tablet (40 mg total) by mouth daily. Take 30-60 min before first meal of the day 30 tablet 2   tamsulosin  (FLOMAX ) 0.4 MG CAPS capsule Take 1 capsule (0.4 mg total) by mouth daily. 90 capsule 3   No facility-administered medications prior to visit.    No Known Allergies  ROS Review of Systems  Constitutional:  Negative for chills and fever.  HENT:  Positive for congestion and tinnitus. Negative for sore throat.   Eyes:  Negative for pain and discharge.  Respiratory:  Positive for cough. Negative for shortness of breath.   Cardiovascular:  Negative for chest pain and palpitations.  Gastrointestinal:  Negative for diarrhea, nausea and vomiting.  Endocrine: Negative for polydipsia and polyuria.  Genitourinary:  Negative for dysuria and hematuria.  Musculoskeletal:  Positive for arthralgias (Bilateral knee). Negative for neck pain and neck stiffness.  Skin:  Negative for rash.  Neurological:  Negative for dizziness, weakness, numbness and headaches.  Psychiatric/Behavioral:  Negative for agitation and behavioral problems.       Objective:    Physical Exam Vitals reviewed.  Constitutional:      General: He is not in acute distress.    Appearance: He is obese. He is not diaphoretic.  HENT:     Head: Normocephalic and atraumatic.     Right Ear: There is impacted cerumen.     Left Ear: There is impacted cerumen.     Nose: Nose normal.     Mouth/Throat:     Mouth: Mucous membranes are moist.  Eyes:     General: No scleral icterus.    Extraocular Movements: Extraocular movements intact.  Cardiovascular:     Rate and Rhythm: Normal rate and regular rhythm.     Heart sounds: Normal heart sounds. No murmur heard. Pulmonary:     Breath sounds: Normal breath sounds. No wheezing or  rales.  Abdominal:     Palpations: Abdomen is soft.     Tenderness: There is no abdominal tenderness.  Musculoskeletal:     Cervical back: Neck supple. No tenderness.     Right knee: Swelling present. Tenderness present.     Left knee: Swelling present. Tenderness present.     Right lower leg: No edema.     Left lower leg: No edema.  Skin:    General: Skin is warm.     Findings: No rash.  Neurological:     General: No focal deficit present.     Mental Status: He is alert and oriented to person, place, and time.  Cranial Nerves: No cranial nerve deficit.     Sensory: No sensory deficit.     Motor: No weakness.  Psychiatric:        Mood and Affect: Mood normal.        Behavior: Behavior normal.     BP 132/82 (BP Location: Left Arm)   Pulse 65   Ht 5' 4 (1.626 m)   Wt 200 lb 6.4 oz (90.9 kg)   SpO2 94%   BMI 34.40 kg/m  Wt Readings from Last 3 Encounters:  10/25/23 200 lb 6.4 oz (90.9 kg)  08/17/23 198 lb 12.8 oz (90.2 kg)  04/27/23 197 lb 9.6 oz (89.6 kg)    Lab Results  Component Value Date   TSH 6.700 (H) 10/23/2023   Lab Results  Component Value Date   WBC 6.5 10/23/2023   HGB 16.3 10/23/2023   HCT 50.1 10/23/2023   MCV 95 10/23/2023   PLT 186 10/23/2023   Lab Results  Component Value Date   NA 139 10/23/2023   K 4.4 10/23/2023   CO2 19 (L) 10/23/2023   GLUCOSE 89 10/23/2023   BUN 21 10/23/2023   CREATININE 0.74 (L) 10/23/2023   BILITOT 0.8 10/23/2023   ALKPHOS 40 (L) 10/23/2023   AST 19 10/23/2023   ALT 28 10/23/2023   PROT 6.8 10/23/2023   ALBUMIN 4.4 10/23/2023   CALCIUM 9.3 10/23/2023   EGFR 102 10/23/2023   Lab Results  Component Value Date   CHOL 168 10/23/2023   Lab Results  Component Value Date   HDL 52 10/23/2023   Lab Results  Component Value Date   LDLCALC 101 (H) 10/23/2023   Lab Results  Component Value Date   TRIG 79 10/23/2023   Lab Results  Component Value Date   CHOLHDL 3.2 10/23/2023   Lab Results   Component Value Date   HGBA1C 5.2 10/23/2023      Assessment & Plan:   Problem List Items Addressed This Visit       Cardiovascular and Mediastinum   Essential hypertension   BP Readings from Last 1 Encounters:  10/25/23 132/82   Well-controlled with Azor  10-20 mg QD Counseled for compliance with the medications Advised DASH diet and moderate exercise/walking, at least 150 mins/week      Relevant Orders   CMP14+EGFR     Respiratory   Severe persistent asthma   Well-controlled with Trelegy Uses albuterol  as needed for dyspnea or wheezing Has chronic cough, takes Mucinex  as needed Followed by pulmonology        Digestive   GERD (gastroesophageal reflux disease)   Well controlled usually Has intermittent bloating -can take PRN Pantoprazole         Nervous and Auditory   Bilateral impacted cerumen   Ear irrigation done bilaterally Mild hearing difficulty and tinnitus could be due to impacted earwax If persistent tinnitus, will refer to ENT specialist         Musculoskeletal and Integument   Primary osteoarthritis of both knees   Bilateral knee pain, followed [facility] in [place] - has been told to get TKA, but he is still thinking about it Advised to take Tylenol arthritis as needed for knee pain He has been using OTC pain relieving cream with mild relief        Genitourinary   Benign prostatic hyperplasia with weak urinary stream   On Flomax , needs to take it regularly        Other   Vitamin D   deficiency    Last vitamin D  Lab Results  Component Value Date   VD25OH 24.2 (L) 10/23/2023   Needs to take vitamin D  2000 IU QD      Encounter for general adult medical examination with abnormal findings - Primary   Annual exam as documented. Counseling done  re healthy lifestyle involving commitment to 150 minutes exercise per week, heart healthy diet, and attaining healthy weight.The importance of adequate sleep also discussed. Immunization  and cancer screening needs are specifically addressed at this visit.      Elevated TSH   Lab Results  Component Value Date   TSH 6.700 (H) 10/23/2023   Asymptomatic currently Will recheck TSH and free T4 in the next visit      Relevant Orders   TSH + free T4   Other Visit Diagnoses       Encounter for immunization       Relevant Orders   Tdap vaccine greater than or equal to 7yo IM (Completed)        No orders of the defined types were placed in this encounter.   Follow-up: Return in about 6 months (around 04/26/2024) for HTN.    Suzzane MARLA Blanch, MD

## 2023-10-25 NOTE — Assessment & Plan Note (Signed)
 Ear irrigation done bilaterally Mild hearing difficulty and tinnitus could be due to impacted earwax If persistent tinnitus, will refer to ENT specialist

## 2023-10-25 NOTE — Assessment & Plan Note (Signed)
 Lab Results  Component Value Date   TSH 6.700 (H) 10/23/2023   Asymptomatic currently Will recheck TSH and free T4 in the next visit

## 2023-10-25 NOTE — Assessment & Plan Note (Signed)
 BP Readings from Last 1 Encounters:  10/25/23 132/82   Well-controlled with Azor  10-20 mg QD Counseled for compliance with the medications Advised DASH diet and moderate exercise/walking, at least 150 mins/week

## 2023-10-25 NOTE — Assessment & Plan Note (Signed)
 Well controlled usually Has intermittent bloating -can take PRN Pantoprazole

## 2023-10-26 LAB — SPECIMEN STATUS REPORT

## 2023-10-26 LAB — T4, FREE: Free T4: 1.29 ng/dL (ref 0.82–1.77)

## 2023-12-27 ENCOUNTER — Encounter (INDEPENDENT_AMBULATORY_CARE_PROVIDER_SITE_OTHER): Payer: Self-pay | Admitting: Gastroenterology

## 2024-03-29 ENCOUNTER — Ambulatory Visit: Admitting: Pulmonary Disease

## 2024-03-29 ENCOUNTER — Encounter: Payer: Self-pay | Admitting: Pulmonary Disease

## 2024-03-29 VITALS — BP 122/72 | HR 68 | Temp 97.8°F | Ht 64.0 in | Wt 199.5 lb

## 2024-03-29 DIAGNOSIS — R0609 Other forms of dyspnea: Secondary | ICD-10-CM

## 2024-03-29 DIAGNOSIS — K2100 Gastro-esophageal reflux disease with esophagitis, without bleeding: Secondary | ICD-10-CM

## 2024-03-29 DIAGNOSIS — J4550 Severe persistent asthma, uncomplicated: Secondary | ICD-10-CM | POA: Diagnosis not present

## 2024-03-29 NOTE — Patient Instructions (Addendum)
 VISIT SUMMARY:  Nicholas Howard, a 64 year old male, visited us  today due to shortness of breath when bending over, particularly during work. He has a history of using an inhaler effectively and recently experienced a respiratory infection that has mostly resolved. We discussed his current symptoms and planned further evaluations.  YOUR PLAN:  - ASTHMA: Asthma is an inflammatory lung disease that causes obstructed airflow from the lungs. Your asthma is well-managed with your current inhaler, so there is no need for a new prescription at this time. Please continue using your inhaler as you have been.  -EXERTIONAL DYSPNEA UNDER CARDIAC EVALUATION: Exertional dyspnea refers to shortness of breath during physical activity, which may be related to heart issues. Since you experience this particularly when bending over and standing up, we have ordered an echocardiogram to evaluate your heart function.  -FOLLOW-UP AFTER RESOLVED RESPIRATORY INFECTION: You recently had a respiratory infection that started with a dry cough and progressed to a productive cough with green phlegm. Although your symptoms have mostly resolved, you still have an occasional cough. We have ordered a pulmonary function test to assess your lung function.  INSTRUCTIONS:  Please follow up with the echocardiogram and pulmonary function test as ordered. Continue using your inhaler as prescribed. If you experience any new or worsening symptoms, please contact our office.  RESUMEN DE LA CONSULTA:  Nicholas Howard, un hombre de 63 aos, nos visit hoy debido a dificultad para respirar al agacharse, especialmente durante el trabajo. Tiene antecedentes de uso eficaz de un inhalador y recientemente padeci una infeccin respiratoria que ya se ha resuelto en su mayor parte. Analizamos sus sntomas actuales y planificamos evaluaciones adicionales.  SU PLAN:  - ASMA: El asma es una enfermedad inflamatoria pulmonar que causa obstruccin del  flujo de aire en los pulmones. Su asma est bien controlada con su inhalador actual, por lo que no es necesaria una nueva receta en este momento. Contine usando su inhalador como lo ha estado haciendo.  - DISNEA DE ESFUERZO BAJO EVALUACIN CARDACA: La disnea de esfuerzo se refiere a la dificultad para respirar durante la actividad fsica, que puede estar relacionada con problemas cardacos. Dado que experimenta esto particularmente al agacharse y levantarse, hemos solicitado un ecocardiograma para evaluar su funcin cardaca.  - SEGUIMIENTO DESPUS DE LA INFECCIN RESPIRATORIA RESUELTA: Recientemente tuvo una infeccin respiratoria que comenz con tos seca y progres a tos productiva con flema verdosa. Aunque sus sntomas se han resuelto en su mayor parte, todava tiene tos ocasional. Hemos solicitado una prueba de funcin pulmonar para evaluar su funcin pulmonar.  INSTRUCCIONES:  Por favor, realice el ecocardiograma y la prueba de funcin pulmonar segn lo indicado. Contine usando su inhalador segn lo prescrito. Si experimenta algn sntoma nuevo o un empeoramiento de los sntomas, comunquese con nuestra clnica.

## 2024-03-29 NOTE — Progress Notes (Signed)
 "  Subjective:    Patient ID: Nicholas Howard, male    DOB: Jun 04, 1960, 64 y.o.   MRN: [id number]  Patient Care Team: Tobie Suzzane POUR, MD as PCP - General (Internal Medicine) Tamea Dedra CROME, MD as Consulting Physician (Pulmonary Disease)  Chief Complaint  Patient presents with   Follow-up    Asthma    BACKGROUND/INTERVAL:Nicholas Howard is a 64 year old Hispanic male, lifelong never smoker, who presents for follow-up on severe persistent asthma without complication.  I first evaluated the patient on 17 March 2022 and at that time he was noted to have significant type II inflammation in the airway with a positive nitric oxide  test of 160 ppb. I instituted therapy with Trelegy Ellipta  200, 1 inhalation daily and continued use of as needed albuterol .  The patient was last seen on 17 August 2023 noting that he was doing well at that time.  No major exacerbations since that visit though he recently had an acute illness but has recovered without exacerbating his asthma.   HPI Discussed the use of AI scribe software for clinical note transcription with the patient, who gave verbal consent to proceed.  History of Present Illness   Nicholas Howard is a 64 year old male who presents with shortness of breath upon bending over.   He experiences shortness of breath when bending over (bendopnea), particularly during work when he needs to straighten up after being bent over. He describes feeling 'agitated' during these episodes. The duration of these symptoms is brief.  He has a history of undergoing a cardiogram (by his description and EKG) in [place] some time ago, but no recent cardiac evaluations have been performed. He continues to use his Trelegy and albuterol , which he finds effective.  He rarely has to use the albuterol .  Recently, during the New Year, he and his family experienced an illness characterized by cough. His wife had a cough with fever, while he only had a cough. Initially, his cough was dry,  but it later produced green phlegm. Currently, he no longer has phlegm, and the cough has mostly resolved.   His gastroesophageal reflux symptoms are well-controlled on PPI.      Review of Systems A 10 point review of systems was performed and it is as noted above otherwise negative.   Patient Active Problem List   Diagnosis Date Noted   Primary osteoarthritis of both knees 10/25/2023   Elevated TSH 10/25/2023   Bilateral impacted cerumen 10/25/2023   History of colonic polyps 02/03/2023   Acute bacterial conjunctivitis of right eye 10/24/2022   Chronic cough 11/25/2021   Encounter for general adult medical examination with abnormal findings 08/20/2021   Severe persistent asthma (HCC) 07/28/2021   Benign prostatic hyperplasia with weak urinary stream 01/15/2021   Vitamin D  deficiency 01/15/2021   GERD (gastroesophageal reflux disease) 11/13/2020   Chronic left-sided low back pain without sciatica 11/13/2020   Encounter for screening for malignant neoplasm of prostate 08/16/2019   Hyperlipemia 04/19/2019   Essential hypertension 03/19/2019   Obesity (BMI 30.0-34.9) 03/19/2019    Social History   Tobacco Use   Smoking status: Never   Smokeless tobacco: Never  Substance Use Topics   Alcohol use: Not Currently    Allergies[1]  Active Medications[2]  Immunization History  Administered Date(s) Administered   Influenza, Seasonal, Injecte, Preservative Fre 04/27/2023   Influenza,inj,Quad PF,6+ Mos 03/19/2019, 02/14/2020, 01/15/2021, 12/08/2021   PFIZER(Purple Top)SARS-COV-2 Vaccination 06/08/2019, 07/02/2019, 02/23/2020   PNEUMOCOCCAL CONJUGATE-20 04/27/2023   Pneumococcal Conjugate-13 08/16/2019  Tdap 08/26/2013, 10/25/2023   Zoster Recombinant(Shingrix) 08/14/2020, 11/13/2020        Objective:     Vitals:   03/29/24 0839  BP: 122/72  Pulse: 68  Temp: 97.8 F (36.6 C)  Height: 5' 4 (1.626 m)  Weight: 199 lb 8 oz (90.5 kg)  SpO2: 99%  BMI (Calculated):  34.23     SpO2: 99 %  GENERAL: Well-developed, well-nourished Hispanic male, no acute distress, no conversational dyspnea, fully ambulatory.  Does have a nasal quality to his speech today. HEAD: Normocephalic, atraumatic.  EYES: Pupils equal, round, reactive to light.  No scleral icterus.  MOUTH:Dentition intact, oral mucosa moist.  No thrush. NECK: Supple. No thyromegaly. Trachea midline. No JVD.  No adenopathy. PULMONARY: Good air entry bilaterally.  No adventitious sounds. CARDIOVASCULAR: S1 and S2. Regular rate and rhythm.  No rubs, murmurs or gallops heard. ABDOMEN: Benign. MUSCULOSKELETAL: No joint deformity, no clubbing, no edema.  NEUROLOGIC: No overt focal deficit, no gait disturbance, speech is fluent. SKIN: Intact,warm,dry. PSYCH: Mood and behavior normal.        Assessment & Plan:     ICD-10-CM   1. Severe persistent asthma without complication (HCC)  J45.50 Pulmonary function test    2. Dyspnea on exertion  R06.09 Pulmonary function test    ECHOCARDIOGRAM COMPLETE    3. Gastroesophageal reflux disease with esophagitis without hemorrhage  K21.00       Orders Placed This Encounter  Procedures   ECHOCARDIOGRAM COMPLETE    Standing Status:   Future    Expected Date:   04/05/2024    Expiration Date:   03/29/2025    Where should this test be performed:   [facility]    Perflutren DEFINITY (image enhancing agent) should be administered unless hypersensitivity or allergy exist:   Administer Perflutren    Reason for exam-Echo:   Dyspnea  R06.00   Pulmonary function test    Standing Status:   Future    Expected Date:   04/12/2024    Expiration Date:   03/29/2025    Where should this test be performed?:   [facility]    What type of PFT is being ordered?:   Full PFT   Discussion:    Severe persistent asthma Well-managed with current inhaler use.  Prescriptions are up-to-date. - Continue current inhaler regimen: Trelegy, as needed albuterol . - PFTs  ordered.  Exertional dyspnea under evaluation Intermittent exertional dyspnea, particularly when bending over and standing up, possibly related to cardiac issues. Previous cardiogram in [place], but further evaluation is warranted. - Ordered echocardiogram to evaluate cardiac function.  Follow-up after resolved respiratory infection Recent respiratory infection with initial dry cough progressing to productive cough with green sputum. Symptoms have resolved, but occasional cough persists. - Ordered pulmonary function test to assess lung function.   Follow-up in 2 months time.     Advised if symptoms do not improve or worsen, to please contact office for sooner follow up or seek emergency care.    I spent 30 minutes of dedicated to the care of this patient on the date of this encounter to include pre-visit review of records, face-to-face time with the patient discussing conditions above, post visit ordering of testing, clinical documentation with the electronic health record, making appropriate referrals as documented, and communicating necessary findings to members of the patients care team.     C. Leita Sanders, MD Advanced Bronchoscopy [facility]    *This note was generated using voice recognition software/Dragon and/or  AI transcription program.  Despite best efforts to proofread, errors can occur which can change the meaning. Any transcriptional errors that result from this process are unintentional and may not be fully corrected at the time of dictation.     [1] No Known Allergies [2]  Current Meds  Medication Sig   albuterol  (VENTOLIN  HFA) 108 (90 Base) MCG/ACT inhaler Inhale 2 puffs into the lungs every 6 (six) hours as needed for wheezing or shortness of breath.   amlodipine -olmesartan  (AZOR ) 10-20 MG tablet Take 1 tablet by mouth daily.   Cholecalciferol (VITAMIN D ) 50 MCG (2000 UT) CAPS Take by mouth daily.   Fluticasone -Umeclidin-Vilant (TRELEGY  ELLIPTA) 200-62.5-25 MCG/ACT AEPB Inhale 1 puff into the lungs daily.   pantoprazole  (PROTONIX ) 40 MG tablet Take 1 tablet (40 mg total) by mouth daily. Take 30-60 min before first meal of the day   tamsulosin  (FLOMAX ) 0.4 MG CAPS capsule Take 1 capsule (0.4 mg total) by mouth daily.   "

## 2024-04-04 ENCOUNTER — Other Ambulatory Visit: Payer: Self-pay | Admitting: Internal Medicine

## 2024-04-04 DIAGNOSIS — I10 Essential (primary) hypertension: Secondary | ICD-10-CM

## 2024-04-11 ENCOUNTER — Telehealth: Payer: Self-pay | Admitting: Pulmonary Disease

## 2024-04-11 NOTE — Telephone Encounter (Signed)
 Dr. Tamea you placed an order for an echo to be done on this patient. The codes are 06693, [id number], [id number]. Prior Auth Not Required for 06693, [phone] but [employer] has denied the code [phone] which is the 3D part of the echo. Is it ok to just do the 2D echo since his insurance denied the 3D

## 2024-04-11 NOTE — Telephone Encounter (Signed)
 I have called the ladies at [facility] and let them know they can get the patient scheduled at [facility] with the codes 06693, [phone]

## 2024-04-11 NOTE — Telephone Encounter (Signed)
 Yes get 2D

## 2024-04-16 ENCOUNTER — Other Ambulatory Visit (HOSPITAL_COMMUNITY)

## 2024-04-29 ENCOUNTER — Ambulatory Visit: Admitting: Internal Medicine

## 2024-05-02 ENCOUNTER — Ambulatory Visit (HOSPITAL_COMMUNITY)
